# Patient Record
Sex: Female | Born: 1955 | Race: White | Hispanic: No | Marital: Married | Smoking: Former smoker
Health system: Southern US, Community
[De-identification: ages and names within clinical notes are randomized; demographics above are authoritative.]

## PROBLEM LIST (undated history)

## (undated) DIAGNOSIS — IMO0001 Reserved for inherently not codable concepts without codable children: Secondary | ICD-10-CM

## (undated) DIAGNOSIS — M1990 Unspecified osteoarthritis, unspecified site: Secondary | ICD-10-CM

## (undated) DIAGNOSIS — N200 Calculus of kidney: Secondary | ICD-10-CM

## (undated) DIAGNOSIS — F419 Anxiety disorder, unspecified: Secondary | ICD-10-CM

## (undated) DIAGNOSIS — C569 Malignant neoplasm of unspecified ovary: Secondary | ICD-10-CM

## (undated) DIAGNOSIS — E785 Hyperlipidemia, unspecified: Secondary | ICD-10-CM

## (undated) DIAGNOSIS — I499 Cardiac arrhythmia, unspecified: Secondary | ICD-10-CM

## (undated) DIAGNOSIS — G2581 Restless legs syndrome: Secondary | ICD-10-CM

## (undated) DIAGNOSIS — F329 Major depressive disorder, single episode, unspecified: Secondary | ICD-10-CM

## (undated) DIAGNOSIS — R079 Chest pain, unspecified: Secondary | ICD-10-CM

## (undated) DIAGNOSIS — Q639 Congenital malformation of kidney, unspecified: Secondary | ICD-10-CM

## (undated) DIAGNOSIS — I739 Peripheral vascular disease, unspecified: Secondary | ICD-10-CM

## (undated) DIAGNOSIS — K219 Gastro-esophageal reflux disease without esophagitis: Secondary | ICD-10-CM

## (undated) DIAGNOSIS — I10 Essential (primary) hypertension: Secondary | ICD-10-CM

## (undated) DIAGNOSIS — R51 Headache: Secondary | ICD-10-CM

## (undated) DIAGNOSIS — R739 Hyperglycemia, unspecified: Secondary | ICD-10-CM

## (undated) DIAGNOSIS — J189 Pneumonia, unspecified organism: Secondary | ICD-10-CM

## (undated) DIAGNOSIS — D649 Anemia, unspecified: Secondary | ICD-10-CM

## (undated) DIAGNOSIS — I82409 Acute embolism and thrombosis of unspecified deep veins of unspecified lower extremity: Secondary | ICD-10-CM

## (undated) DIAGNOSIS — M797 Fibromyalgia: Secondary | ICD-10-CM

## (undated) DIAGNOSIS — E215 Disorder of parathyroid gland, unspecified: Secondary | ICD-10-CM

## (undated) DIAGNOSIS — T8859XA Other complications of anesthesia, initial encounter: Secondary | ICD-10-CM

## (undated) DIAGNOSIS — C679 Malignant neoplasm of bladder, unspecified: Secondary | ICD-10-CM

## (undated) DIAGNOSIS — T4145XA Adverse effect of unspecified anesthetic, initial encounter: Secondary | ICD-10-CM

## (undated) DIAGNOSIS — F32A Depression, unspecified: Secondary | ICD-10-CM

## (undated) DIAGNOSIS — R519 Headache, unspecified: Secondary | ICD-10-CM

## (undated) DIAGNOSIS — Z9289 Personal history of other medical treatment: Secondary | ICD-10-CM

## (undated) DIAGNOSIS — K8020 Calculus of gallbladder without cholecystitis without obstruction: Secondary | ICD-10-CM

## (undated) HISTORY — PX: BREAST EXCISIONAL BIOPSY: SUR124

## (undated) HISTORY — DX: Gastro-esophageal reflux disease without esophagitis: K21.9

## (undated) HISTORY — DX: Calculus of gallbladder without cholecystitis without obstruction: K80.20

## (undated) HISTORY — PX: CARPAL TUNNEL RELEASE: SHX101

## (undated) HISTORY — DX: Hyperlipidemia, unspecified: E78.5

## (undated) HISTORY — PX: BREAST CYST EXCISION: SHX579

## (undated) HISTORY — DX: Personal history of other medical treatment: Z92.89

## (undated) HISTORY — PX: BREAST LUMPECTOMY: SHX2

## (undated) HISTORY — PX: FRACTURE SURGERY: SHX138

## (undated) HISTORY — DX: Anxiety disorder, unspecified: F41.9

## (undated) HISTORY — PX: BILATERAL SALPINGOOPHORECTOMY: SHX1223

## (undated) HISTORY — DX: Hyperglycemia, unspecified: R73.9

## (undated) HISTORY — DX: Acute embolism and thrombosis of unspecified deep veins of unspecified lower extremity: I82.409

## (undated) HISTORY — DX: Cardiac arrhythmia, unspecified: I49.9

## (undated) HISTORY — DX: Peripheral vascular disease, unspecified: I73.9

## (undated) HISTORY — PX: TONSILLECTOMY: SUR1361

---

## 1978-09-09 HISTORY — PX: TUBAL LIGATION: SHX77

## 2005-04-23 ENCOUNTER — Ambulatory Visit (HOSPITAL_COMMUNITY): Admission: RE | Admit: 2005-04-23 | Discharge: 2005-04-23 | Payer: Self-pay | Admitting: Family Medicine

## 2005-05-27 ENCOUNTER — Emergency Department (HOSPITAL_COMMUNITY): Admission: EM | Admit: 2005-05-27 | Discharge: 2005-05-27 | Payer: Self-pay | Admitting: Emergency Medicine

## 2005-05-31 ENCOUNTER — Encounter: Admission: RE | Admit: 2005-05-31 | Discharge: 2005-05-31 | Payer: Self-pay | Admitting: Neurological Surgery

## 2005-09-03 ENCOUNTER — Emergency Department (HOSPITAL_COMMUNITY): Admission: EM | Admit: 2005-09-03 | Discharge: 2005-09-03 | Payer: Self-pay | Admitting: Emergency Medicine

## 2005-09-09 HISTORY — PX: CARDIAC CATHETERIZATION: SHX172

## 2005-11-27 ENCOUNTER — Inpatient Hospital Stay (HOSPITAL_COMMUNITY): Admission: EM | Admit: 2005-11-27 | Discharge: 2005-11-28 | Payer: Self-pay | Admitting: Emergency Medicine

## 2005-12-02 ENCOUNTER — Encounter: Admission: RE | Admit: 2005-12-02 | Discharge: 2005-12-02 | Payer: Self-pay | Admitting: Gastroenterology

## 2005-12-12 ENCOUNTER — Encounter: Admission: RE | Admit: 2005-12-12 | Discharge: 2005-12-12 | Payer: Self-pay | Admitting: Surgery

## 2006-01-07 DIAGNOSIS — I82409 Acute embolism and thrombosis of unspecified deep veins of unspecified lower extremity: Secondary | ICD-10-CM

## 2006-01-07 HISTORY — PX: CHOLECYSTECTOMY: SHX55

## 2006-01-07 HISTORY — DX: Acute embolism and thrombosis of unspecified deep veins of unspecified lower extremity: I82.409

## 2006-01-17 ENCOUNTER — Encounter (INDEPENDENT_AMBULATORY_CARE_PROVIDER_SITE_OTHER): Payer: Self-pay | Admitting: *Deleted

## 2006-01-17 ENCOUNTER — Ambulatory Visit (HOSPITAL_COMMUNITY): Admission: RE | Admit: 2006-01-17 | Discharge: 2006-01-17 | Payer: Self-pay | Admitting: Surgery

## 2006-02-14 ENCOUNTER — Emergency Department (HOSPITAL_COMMUNITY): Admission: EM | Admit: 2006-02-14 | Discharge: 2006-02-14 | Payer: Self-pay | Admitting: Emergency Medicine

## 2006-09-15 ENCOUNTER — Observation Stay (HOSPITAL_COMMUNITY): Admission: EM | Admit: 2006-09-15 | Discharge: 2006-09-16 | Payer: Self-pay | Admitting: Emergency Medicine

## 2007-01-26 ENCOUNTER — Emergency Department (HOSPITAL_COMMUNITY): Admission: EM | Admit: 2007-01-26 | Discharge: 2007-01-26 | Payer: Self-pay | Admitting: Emergency Medicine

## 2011-01-25 NOTE — Op Note (Signed)
NAMESWEETIE, GIEBLER                ACCOUNT NO.:  [id number]   MEDICAL RECORD NO.:  [id number]          PATIENT TYPE:  AMB   LOCATION:  SDS                          FACILITY:  [facility]   PHYSICIAN:  Wilmon Arms. Corliss Skains, M.D. DATE OF BIRTH:  01-Mar-1956   DATE OF PROCEDURE:  01/17/2006  DATE OF DISCHARGE:  01/17/2006                                 OPERATIVE REPORT   PREOPERATIVE DIAGNOSIS:  Symptomatic cholelithiasis   POSTOPERATIVE DIAGNOSIS:  Symptomatic cholelithiasis   PROCEDURE PERFORMED:  Laparoscopic cholecystectomy with intraoperative  cholangiogram.   SURGEON:  Wilmon Arms. Corliss Skains, M.D.   ASSISTANT:  Cherylynn Ridges, M.D.   ANESTHESIA:  General endotracheal   INDICATIONS:  The patient is a 49-year female who was hospitalized in March  2007 for chest pain.  She underwent a complete cardiac workup including a  catheterization which was normal.  She has since been evaluated by Dr. Ewing Schlein  for noncardiac chest pain.  She has a long history of gastroesophageal  reflux.  This included a hiatal hernia.  An ultrasound showed multiple  gallstones but no wall thickening and normal common bile duct.  Her liver  function tests were normal.  She had an upper GI which showed a very small  sliding hiatal hernia with mild reflux but otherwise normal.  Due to these  findings and the patient's symptoms, we recommended a laparoscopic  cholecystectomy.   DESCRIPTION OF PROCEDURE:  The patient was brought to the operating and  placed in supine position on the operating table.  After an adequate level  of general endotracheal anesthesia was obtained, the patient's abdomen was  prepped with Betadine and draped in a sterile fashion.  A time-out was taken  to insure the proper patient and proper procedure.  The patient had a  previous umbilical laparoscopic incision.  This area was infiltrated 0.25%  Marcaine with epinephrine.  The umbilical incision was reopened.  Dissection  was carried down to the  fascia which was divided vertically.  The sides of  the fascia were grasped with Kocher clamps.  The peritoneum was bluntly  entered.  A stay sutures of 0 Vicryl was placed in a purse string fashion  and used to hold the Hasson cannula in place.  Pneumoperitoneum was obtained  by insufflating CO2 and maintaining a maximal pressure of 50 mmHg.  The the  scope was inserted and gross visual examination of the abdomen showed no  gross abnormalities.  The patient was positioned in reversed Trendelenburg  position and rotated slightly to her left.  Two 5 mm ports were placed in  the right upper quadrant and a 10 mm port was placed in a subxiphoid  position.  The gallbladder was grasped with a clamp and elevated over the  edge of the liver.  There were dense adhesions of the omentum and transverse  colon to the under surface of the gallbladder and liver.  These were taken  down sharply.  Once we were able to visualize the gallbladder completely, we  opened the peritoneum around the hilum of the gallbladder.  The cystic duct  was circumferentially dissected and ligated with a clip distally.  A small  opening was created on the cystic duct.  A Cook cholangiogram catheter was  inserted through a separate stab incision and threaded into the cystic duct.  It was secured with a clip and a cholangiogram showed a good flow proximally  and distally with no filling defects.  There was good flow into the  duodenum.  The cholangiogram catheter was then removed and the cystic duct  was ligated with clips and divided.  The cystic artery was also ligated with  clips and divided.  Cautery was then used to dissect the gallbladder free  from the liver bed.  Cautery was used for hemostasis in the wall of the  gallbladder fossa.  The gallbladder was then removed through the umbilical  incision.  The purse string suture was tied down to close the umbilical  fascia.  We reinspected the right upper quadrant and no  bleeding was noted.  The irrigant was suctioned out.  The ports were then removed under direct  vision while releasing the pneumoperitoneum.  The wounds were closed with 4-  0 Monocryl in a subcuticular fashion.  Steri-Strips and clean dressings were  applied.  The patient was extubated and brought to the recovery room in  stable condition.  All sponge, instrument, and needle counts correct.      Wilmon Arms. Tsuei, M.D.  Electronically Signed     MKT/MEDQ  D:  01/17/2006  T:  01/17/2006  Job:  [id number]   cc:   Petra Kuba, M.D.  Fax: [phone]

## 2011-01-25 NOTE — Discharge Summary (Signed)
[name], [name] NO.:  [id number]   MEDICAL RECORD NO.:  [id number]          PATIENT TYPE:  OBV   LOCATION:  4703                         FACILITY:  [facility]   PHYSICIAN:  Corky Crafts, MDDATE OF BIRTH:  1956-02-10   DATE OF ADMISSION:  09/15/2006  DATE OF DISCHARGE:  09/16/2006                               DISCHARGE SUMMARY   ADMISSION DIAGNOSES:  1. Chest pain, resolved.  2. Dyslipidemia, resolved.  3. Palpitations, resolved.   DISCHARGE DIAGNOSES:  1. Chest pain, resolved.  Myocardial infarction ruled out with      negative serial enzymes x2 with a peak troponin of less than 0.01.  2. Normal coronary arteries via cardiac catheterization November 27, 2005.  3. Normal left ventricular systolic function with an ejection fraction      of 50% via cardiac catheterization November 27, 2005.  4. Dyspnea, resolved.  Check BNP prior to discharge.  5. Palpitations, resolved.  The patient has a history of normal sinus      rhythm with occasional sinus tachycardia via Holter monitor in      December of 2006.  6. Hypertension, controlled.  7. Dyslipidemia.  8. Gastroesophageal reflux disease.   PROCEDURES:  None during this admission.   HOSPITAL COURSE:  [name] is a 55 year old female with no known  history of coronary artery disease.  She has normal coronary arteries  via cardiac catheterization, as well as LV systolic function an EF of  60% via cardiac catheterization on November 27, 2005.  She was admitted to  the [facility] for 23 hour observation on yesterday with a  chief complaint of chest pain, dyspnea and occasional palpitations.  Chest x-ray revealed no acute disease.  A 12-lead EKG revealed normal  sinus rhythm with a ventricular rate of 69 beats per minute with  nonspecific T-wave abnormalities with T-wave inversion in leads V1 and  V2, as well as flattened T-waves in AVL.  There was no evidence of  ischemia.  Serial cardiac enzymes  were negative x2 with a peak troponin  of less than 0.01.  TSH was normal.  BNP is pending prior to discharge.  The patient is being discharged home today in stable condition without  any further complaints of chest pain, shortness of breath or  palpitations.  On telemetry, she remained in normal sinus rhythm without  any episodes.  The patient was seen and examined by Dr. Everette Rank  prior to discharge and he is in agreement with this assessment and  discharge.   LABORATORY DATA:  TSH 0.748.  Serial cardiac enzymes:  CK total 94 and  64 respectively.  MB 1.7 and 1.1.  Troponin-I less than 0.01 x2.  Sodium  144, potassium 3.9, chloride 107, CO2 28, glucose 84, BUN 11, creatinine  0.97.  White blood count 5.9, hemoglobin 13.4, hematocrit 39.5,  platelets 215,000.  PT 12.2, INR 0.9, PTT 25.  Magnesium 2.5.  Uncorrected calcium 9.9.   X-rays as stated in the hospital course.   EKGs as stated in the hospital course.  CONDITION UPON DISCHARGE:  [name] is being discharged home today in  stable condition without any further complaint of chest pain, shortness  of breath or palpitations.   DISCHARGE MEDICATIONS:  The patient has been instructed to continue her  current medications as follows:  1. Atenolol 25 mg daily.  2. Coated aspirin 81 mg daily.  3. Enalapril 5 mg daily.  4. Pravastatin 20 mg daily.  5. Pepcid OTC 20 mg twice daily.   DISCHARGE INSTRUCTIONS:  Continue to follow a low-sodium, low-fat and  low-cholesterol diet.  There are no further restrictions.   FOLLOWUP ARRANGEMENTS:  Follow up with Dr. Eldridge Dace as scheduled in  March of 2008 or as needed.      [address] [place], [place]      Corky Crafts, MD  Electronically Signed    RDM/MEDQ  D:  09/16/2006  T:  09/16/2006  Job:  [id number]   cc:   Dario Guardian, M.D.

## 2011-01-25 NOTE — H&P (Signed)
[name], CERRO NO.:  [id number]   MEDICAL RECORD NO.:  [id number]          PATIENT TYPE:  EMS   LOCATION:  MAJO                         FACILITY:  [facility]   PHYSICIAN:  Corky Crafts, MDDATE OF BIRTH:  1955/11/12   DATE OF ADMISSION:  09/15/2006  DATE OF DISCHARGE:                              HISTORY & PHYSICAL   PRIMARY CARE PHYSICIAN:  Merri Brunette, MD at [facility].   CARDIOLOGIST:  Lance Muss, MD   CHIEF COMPLAINT:  Chest pain, shortness of breath, and palpitations.   HISTORY OF PRESENT ILLNESS:  [name] is a 55 year old Caucasian  female without a known cardiac history.  She has a history of  hypertension, dyslipidemia, and DVT in [facility] 2006.  She also has a  history of normal coronary arteries with normal LV systolic function and  EF of 60% via cardiac catheterization on March21,2007.  The  patient presented to the [facility] ED with a chief complaint of  intermittent, nonexertional chest pain that is described as tightness  for 1 week with radiation to her left shoulder and left shoulder blade,  leaving her left upper arm with a feeling of tightness as if a blood  pressure cuff is around her arm.  She admits to shortness of breath and  occasional palpitations for the past month that are also associated with  the chest pain.  She also admits to dizziness.  She denied diaphoresis,  nausea, or vomiting.  She rates the chest pain as 7/10 and denies  treatment for the chest pain.  She also denies a change in the chest  pain with inspiration, movement, meals, or palpation.  She has a history  of chest pain in [facility] 2007 that is not similar in quality or  character to this episode of chest pain.  She indicated that this  episode of chest pain is less severe.  Previous Holter monitor results  in [facility] 2006 revealed normal sinus rhythm and sinus  tachycardia.  On telemetry, the patient's heart rate is between 70 and  80  beats per minute.  A 12-lead EKG was performed and revealed normal  sinus rhythm with a ventricular rate of 69 beats per minute with  nonspecific T wave abnormalities with T wave inversion in leads V1 and  V2 and flattened T waves in aVL.  Point-of-care enzymes and portable  chest x-ray results are pending.   PAST MEDICAL HISTORY:  1. Hypertension.  2. Dyslipidemia.  3. Remote history of DVT in [facility] 2006, currently not on      anticoagulation.  4. Normal coronary arteries via cardiac catheterization on November 27, 2005.  5. Normal LV systolic function with the EF of 60% via cardiac      catheterization on November 27, 2005.  6. Remote history of tobacco abuse with cessation in [id number].  7. Restless leg syndrome that is not currently treated with      medications.  8. History of pneumonia in August and [facility] 2006.  9. History of ovarian cysts, status post oophorectomy.  10.Status post  cholecystectomy in [facility] 2007.   ALLERGIES:  IODINE-CONTAINING IV CONTRAST DYE -- shortness of breath.   CURRENT MEDICATIONS:  1. Atenolol 25 mg daily.  2. Enalapril daily (unknown dosage).  3. Pepcid 20 mg twice daily.  4. Pravastatin 20 mg daily.  5. Aspirin 81 mg daily.   FAMILY HISTORY:  1. Father living, age 68, with a neck aneurysm, prostate cancer.  2. Mother living, age 54, severe hypertension, diabetes mellitus.  3. Brother living, age 35, status post MI at 53 years of age.   SOCIAL HISTORY:  Married with 2 adult children, both healthy.  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  She has a remote  history of tobacco abuse with a 56-pack-year smoking history with  cessation in March2006.  She currently denies tobacco, alcohol, or  illicit drug use.   REVIEW OF SYSTEMS:  Positive for bilateral leg cramps at night, urinary  frequency, urgency, and retention.   PHYSICAL EXAM:  GENERAL:  A 55 year old female, pleasant and  cooperative, NAD.  VITALS:  Temperature  97.8, blood pressure 117/69, pulse 71 and regular,  respirations 18, O2 saturations 97% over room air.  HEENT:  Unremarkable.  NECK:  Supple without JVD or carotid bruits, bilaterally.  Carotid  upstrokes 2+.  PULMONARY:  Breath sounds are equal and clear to auscultation  bilaterally.  No use of accessory muscles.  CV:  Regular rate and rhythm.  Normal S1 and S2 without murmurs,  gallops, clicks or rubs.  ABDOMEN:  Soft, nontender, nondistended with  active bowel sounds.  No masses, organomegaly, or bilateral bruits.  EXTREMITIES:  No peripheral edema.  PT pulses 2+/2, bilaterally.  SKIN:  Warm and dry without rashes or lesions.  NEUROLOGIC:  No focal motor or sensory deficits.  PSYCHIATRIC:  Normal mood and affect.  BACK:  No kyphosis or scoliosis.   LABORATORY DATA:  White blood count 7.7, hemoglobin 14.1, hematocrit  41.5, platelets 229,000.  Sodium 138, potassium 4.2, chloride 107, CO2  26.6, BUN 13, creatinine 1.0, glucose 103.  PT 12.2, INR 0.9, PTT 25.  TSH, BNP, magnesium, point-of-care enzymes, and portable chest x-ray  results are pending.   EKG as stated in the HPI.   ASSESSMENT:  1. Possible acute coronary syndrome consistent with unstable angina.      Questionable etiology.  The patient has a history of normal      coronary arteries via cardiac catheterization in March 2007.  2. Palpitations with a controlled heart rate.  3. Hypertension, controlled.  4. Dyslipidemia.  5. History of normal coronary arteries via cardiac catheterization,      November 27, 2005.  6. Normal left ventricular systolic function with ejection fraction of      60% via cardiac catheterization, November 27, 2005.  7. Urinary frequency.  8. As stated in the past medical history.   PLAN:  1. Admit to cardiac telemetry unit for 23-hour observation under the      service of Dr. Everette Rank with a diagnosis of unstable angina. 2. Rule out MI.  Point-of-care and serial cardiac enzymes q.8 h. x3       are pending.  Notify MD if enzymes are elevated.  3. Continue home medications.  4. Subcu Lovenox q.12 h. per pharmacy protocol for DVT prophylaxis.  5. BMET and CBC in the a.m.  6. Twelve-lead EKG with recurrent chest pain.  7. Sublingual nitroglycerin 0.4 mg x3 as needed for chest pain.  8. Initiate Cardiology p.r.n.  orders.  9. The patient was seen, interviewed, and examined by Dr. Everette Rank, who participated in the medical decision-making and plan      of care.      [address] [place], [place]      Corky Crafts, MD  Electronically Signed    RDM/MEDQ  D:  09/15/2006  T:  09/16/2006  Job:  [id number]   cc:   Dario Guardian, M.D.

## 2011-01-25 NOTE — Discharge Summary (Signed)
[name], [name]                ACCOUNT NO.:  [id number]   MEDICAL RECORD NO.:  [id number]          PATIENT TYPE:  INP   LOCATION:  6532                         FACILITY:  [facility]   PHYSICIAN:  Corky Crafts, MDDATE OF BIRTH:  [birth year]   DATE OF ADMISSION:  11/27/2005  DATE OF DISCHARGE:  11/28/2005                                 DISCHARGE SUMMARY   DISCHARGE DIAGNOSES:  1.  Chest pain, noncardiac.  2.  Hypertension, treated.  3.  Remote history of deep venous thrombosis, not currently on Coumadin.  4.  Recent pneumonia in August and December of 2006.  5.  History of oophorectomy secondary to cancer.  6.  IV dye allergy.   Ms. Koike is a 55 year old female who presented to the emergency room on  November 27, 2005, with chest pain that has progressively worsened throughout  the day.  His chest pain was associated with left arm.  Because the pain  continued, she proceeded to the emergency room where EKG showed  T-wave  inversion in the anterior leads with minimal ST segment depression in the  inferolateral leads 1 mm  with minimal downsloping.  She was seen in  consultation by Dr. Delfin Edis who felt the patient needed to proceed to  the catheterization lab emergently.  Fortunately, cardiac catheterization  showed normal coronaries with normal left ventriculogram.  She remained in  the hospital overnight.  The following day, her lab studies were normal with  a BUN of 17, creatinine 1.1, hemoglobin 14.4, hematocrit 41.5.  Her LFTs  were normal.  She still complains of occasional chest pressure.  We did ask  for a gastroenterology consult through [facility], Petra Kuba, M.D.  At  this point, we feel she is ready for discharge to home and can have an  outpatient gastroenterology work-up.  I will discuss this with Dr. Ewing Schlein to  assure that we can set up this appointment.  In the meantime, she is to  continue her atenolol, Nexium, cholesterol medication, hydrochlorothiazide  and blood pressure pills as prior to admission.  I have asked her to bring  all these medications to the office for confirmation of names and dosages.  We have added a baby aspirin 81 mg a day to her medication regimen.  She is  to clean over her cath site with  soap and water.  No lifting over 10 pounds  for one week.  No  driving for two days.  Remain on low fat diet. She has an appointment to see  Dr. Eldridge Dace on December 04, 2005, at 1 p.m.  She is to call for any further  pain.   I saw and examined this patient on the day of discharge. -Everette Rank, MD      Guy Franco, P.A.      Corky Crafts, MD  Electronically Signed    LB/MEDQ  D:  11/28/2005  T:  11/29/2005  Job:  [id number]   cc:   Petra Kuba, M.D.  Fax: [phone]   Dario Guardian, M.D.  Fax: [phone]

## 2011-01-25 NOTE — H&P (Signed)
[name], TESCH                ACCOUNT NO.:  [id number]   MEDICAL RECORD NO.:  [id number]          PATIENT TYPE:  INP   LOCATION:  6532                         FACILITY:  [facility]   PHYSICIAN:  Colleen Can. Deborah Chalk, M.D.DATE OF BIRTH:  1955-11-28   DATE OF ADMISSION:  11/27/2005  DATE OF DISCHARGE:                                HISTORY & PHYSICAL   Ms. Yerian presented to the emergency department earlier today with onset of  chest pain that basically occurred after she was getting in her car to go to  work at 7 a.m. this morning.  It progressed throughout the course of the  day.  It is described as an indigestion-like feeling, however, it has  radiated now into the left arm.  She has had some associated shortness of  breath.  She is not sure about diaphoresis because that has been somewhat of  a chronic problem for her.  Apparently, she has had similar episodes, yet  not this severe.  She had a stress test done at [facility] in December  of 2006.  Apparently she has problems with tachycardia and the possibility  of pacemaker has been entertained in the past.  Her full history is not  available.  Her pain has basically continued throughout the course of the  day.  She was referred to the emergency room midday.  We were called here at  6 p.m.  Her EKG is noted to be abnormal.  She is continuing to have chest  pain and she is taken to the catheterization lab emergently.   PAST MEDICAL HISTORY:  She does have hypertension, hypercholesterolemia.  She had a negative stress test in December of 2006.  She is not diabetic.  She did have a DVT 1 year ago.  She has had recent pneumonia x2.  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   ALLERGIES:  X-RAY DYE which causes swelling.   MEDICATIONS:  1.  Atenolol.  2.  Nexium.  3.  Cholesterol pill.  4.  Hydrochlorothiazide.  5.  Blood pressure pill of which she does not know.   FAMILY HISTORY:  Father is alive at age  53.  Mother is alive at 46 and has  heart disease.  One brother age 53 has had a heart attack already.   SOCIAL HISTORY:  She is the Production designer, theatre/television/film of a school cafeteria.  She is married  with two grown children.  She has had no smoking for 1 year, but prior to  that smoked two packs a day.  She has no alcohol use.   REVIEW OF SYSTEMS:  Positive for tachycardia, edema, leg cramps, increased  stress, headaches, and abdominal fullness.   PHYSICAL EXAMINATION:  GENERAL:  She is currently complaining of chest pain.  VITAL SIGNS:  Blood pressure is 117/69, heart rate is 75, respiratory rate  16.  SKIN:  Warm and dry.  Color is tan.  LUNGS:  Basically clear.  HEART:  Regular rhythm.  ABDOMEN:  Soft and nontender.  EXTREMITIES:  Without edema.  NEUROLOGY:  No gross focal deficits.  LABORATORY DATA:  Cardiac markers are negative x2.  CBC is normal.  Chemistries are normal.   EKG shows anterior ischemia.   IMPRESSION:  1.  Chest pain.  2.  Hyperlipidemia.  3.  Hypertension.  4.  Positive family history.   PLAN:  She is emergently taken to the cardiac catheterization lab.  The  risks, procedure, and benefits have all been explained to her and she is  willing to proceed.      Sharlee Blew, N.P.      Colleen Can. Deborah Chalk, M.D.  Electronically Signed    LC/MEDQ  D:  11/27/2005  T:  11/28/2005  Job:  [id number]

## 2011-01-25 NOTE — Cardiovascular Report (Signed)
NAMESYANN, [name]                ACCOUNT NO.:  [id number]   MEDICAL RECORD NO.:  [id number]          PATIENT TYPE:  INP   LOCATION:  6532                         FACILITY:  [facility]   PHYSICIAN:  Colleen Can. Deborah Chalk, M.D.DATE OF BIRTH:  31-Aug-1956   DATE OF PROCEDURE:  11/27/2005  DATE OF DISCHARGE:  11/28/2005                              CARDIAC CATHETERIZATION   INDICATIONS FOR PROCEDURE:  MS. Hale Bogus presented to the emergency room with  prolonged chest pain and had T-wave inversions anteriorly. Cardiac enzymes  were negative.   PROCEDURE:  Left heart catheterization with selective coronary angiography  and left ventricular angiography.   TYPE AND SITE OF ENTRY:  Percutaneous right femoral artery graft.   CATHETERS:  A 6-French #4 curved Judkins right and left coronary cath, 6-  French pigtail ventriculographic catheter.   CONTRAST MATERIAL:  Omnipaque.   MEDICATIONS:  Medication given prior to procedure, Benadryl 50 milligrams  IV, Solu-Medrol 60 milligrams IV. Medications given during the procedure,  Versed 2 milligrams IV, fentanyl 25 micrograms IV.   COMMENT:  The patient tolerated the procedure well.   HEMODYNAMIC RESULTS:  Aortic pressure 141/81, LV is 143/7-21. There is no  aortic valve gradient noted on pullback.   ANGIOGRAPHIC DATA:  Left ventricular angiogram performed in the RAO  position. Overall cardiac size and silhouette were normal. The global  ejection fraction was 60%. Regional wall motion is normal.   1.  Right coronary artery: The right coronary artery is a large dominant      vessel. It is a posterior lateral branch and posterior descending      vessel. It is normal.  2.  Left main coronary artery is normal.  3.  Left circumflex: Left circumflex continues as four branches. They are      normal.  4.  Intermediate coronary: Intermediate coronary is normal.  5.  Left anterior descending: Left anterior descending is moderate-sized      vessel that ends  at the apex. It is normal.   OVERALL IMPRESSION:  1.  Normal left ventricular function.  2.  Normal coronary arteries.   The patient had femoral site closed with AngioSeal. She tolerated the  procedure well.      Colleen Can. Deborah Chalk, M.D.  Electronically Signed     SNT/MEDQ  D:  11/27/2005  T:  11/28/2005  Job:  [id number]   cc:   Corky Crafts, MD  Fax: [phone]

## 2012-04-03 ENCOUNTER — Inpatient Hospital Stay (HOSPITAL_COMMUNITY)
Admission: EM | Admit: 2012-04-03 | Discharge: 2012-04-04 | DRG: 313 | Disposition: A | Discharge status: Home / Self Care | Payer: Worker's Compensation | Attending: Internal Medicine | Admitting: Internal Medicine

## 2012-04-03 ENCOUNTER — Encounter (HOSPITAL_COMMUNITY): Payer: Self-pay | Admitting: *Deleted

## 2012-04-03 ENCOUNTER — Emergency Department (HOSPITAL_COMMUNITY): Payer: Worker's Compensation

## 2012-04-03 DIAGNOSIS — Z79899 Other long term (current) drug therapy: Secondary | ICD-10-CM

## 2012-04-03 DIAGNOSIS — Z7982 Long term (current) use of aspirin: Secondary | ICD-10-CM

## 2012-04-03 DIAGNOSIS — R079 Chest pain, unspecified: Secondary | ICD-10-CM

## 2012-04-03 DIAGNOSIS — IMO0001 Reserved for inherently not codable concepts without codable children: Secondary | ICD-10-CM | POA: Diagnosis present

## 2012-04-03 DIAGNOSIS — Z87891 Personal history of nicotine dependence: Secondary | ICD-10-CM

## 2012-04-03 DIAGNOSIS — I959 Hypotension, unspecified: Secondary | ICD-10-CM | POA: Diagnosis present

## 2012-04-03 DIAGNOSIS — K219 Gastro-esophageal reflux disease without esophagitis: Secondary | ICD-10-CM | POA: Diagnosis present

## 2012-04-03 DIAGNOSIS — R339 Retention of urine, unspecified: Secondary | ICD-10-CM

## 2012-04-03 DIAGNOSIS — J9819 Other pulmonary collapse: Secondary | ICD-10-CM | POA: Diagnosis present

## 2012-04-03 DIAGNOSIS — F3289 Other specified depressive episodes: Secondary | ICD-10-CM | POA: Diagnosis present

## 2012-04-03 DIAGNOSIS — R0789 Other chest pain: Principal | ICD-10-CM | POA: Diagnosis present

## 2012-04-03 DIAGNOSIS — J189 Pneumonia, unspecified organism: Secondary | ICD-10-CM

## 2012-04-03 DIAGNOSIS — M129 Arthropathy, unspecified: Secondary | ICD-10-CM | POA: Diagnosis present

## 2012-04-03 DIAGNOSIS — F329 Major depressive disorder, single episode, unspecified: Secondary | ICD-10-CM

## 2012-04-03 DIAGNOSIS — M797 Fibromyalgia: Secondary | ICD-10-CM | POA: Diagnosis present

## 2012-04-03 DIAGNOSIS — G2581 Restless legs syndrome: Secondary | ICD-10-CM

## 2012-04-03 DIAGNOSIS — I10 Essential (primary) hypertension: Secondary | ICD-10-CM | POA: Diagnosis present

## 2012-04-03 DIAGNOSIS — F32A Depression, unspecified: Secondary | ICD-10-CM | POA: Diagnosis present

## 2012-04-03 HISTORY — DX: Essential (primary) hypertension: I10

## 2012-04-03 HISTORY — DX: Depression, unspecified: F32.A

## 2012-04-03 HISTORY — DX: Restless legs syndrome: G25.81

## 2012-04-03 HISTORY — DX: Fibromyalgia: M79.7

## 2012-04-03 HISTORY — DX: Major depressive disorder, single episode, unspecified: F32.9

## 2012-04-03 HISTORY — DX: Unspecified osteoarthritis, unspecified site: M19.90

## 2012-04-03 HISTORY — DX: Chest pain, unspecified: R07.9

## 2012-04-03 LAB — BASIC METABOLIC PANEL
BUN: 19 mg/dL (ref 6–23)
Chloride: 105 mEq/L (ref 96–112)
Creatinine, Ser: 0.97 mg/dL (ref 0.50–1.10)
Glucose, Bld: 93 mg/dL (ref 70–99)
Potassium: 4.2 mEq/L (ref 3.5–5.1)

## 2012-04-03 LAB — CREATININE, SERUM
GFR calc Af Amer: 82 mL/min — ABNORMAL LOW (ref >90)
GFR calc non Af Amer: 71 mL/min — ABNORMAL LOW (ref >90)

## 2012-04-03 LAB — CBC
HCT: 36.9 % (ref 36.0–46.0)
HCT: 39.2 % (ref 36.0–46.0)
Hemoglobin: 12.6 g/dL (ref 12.0–15.0)
Hemoglobin: 13.9 g/dL (ref 12.0–15.0)
MCV: 89.8 fL (ref 78.0–100.0)
MCV: 90.7 fL (ref 78.0–100.0)
RBC: 4.32 MIL/uL (ref 3.87–5.11)
RDW: 12.4 % (ref 11.5–15.5)
WBC: 5.8 10*3/uL (ref 4.0–10.5)
WBC: 5.1 10*3/uL (ref 4.0–10.5)

## 2012-04-03 LAB — CARDIAC PANEL(CRET KIN+CKTOT+MB+TROPI)
Relative Index: INVALID (ref 0.0–2.5)
Total CK: 95 U/L (ref 7–177)
Troponin I: <0.3 ng/mL (ref <0.30)

## 2012-04-03 MED ORDER — SODIUM CHLORIDE 0.9 % IJ SOLN: 3.0000 mL | Freq: Two times a day (BID) | INTRAMUSCULAR | Status: DC

## 2012-04-03 MED ORDER — MOXIFLOXACIN HCL IN NACL 400 MG/250ML IV SOLN
400.0000 mg | Freq: Once | INTRAVENOUS | Status: AC
  Administered 2012-04-03: 400 mg via INTRAVENOUS
  Filled 2012-04-03: qty 250

## 2012-04-03 MED ORDER — SODIUM CHLORIDE 0.9 % IV SOLN: 250.0000 mL | INTRAVENOUS | Status: DC | PRN

## 2012-04-03 MED ORDER — HEPARIN SODIUM (PORCINE) 5000 UNIT/ML IJ SOLN
5000.0000 [IU] | Freq: Three times a day (TID) | INTRAMUSCULAR | Status: DC
  Administered 2012-04-03 – 2012-04-04 (×2): 5000 [IU] via SUBCUTANEOUS
  Filled 2012-04-03 (×5): qty 1

## 2012-04-03 MED ORDER — SODIUM CHLORIDE 0.9 % IJ SOLN: 3.0000 mL | INTRAMUSCULAR | Status: DC | PRN

## 2012-04-03 MED ORDER — ASPIRIN EC 81 MG PO TBEC
81.0000 mg | DELAYED_RELEASE_TABLET | Freq: Every day | ORAL | Status: DC
  Administered 2012-04-04: 81 mg via ORAL
  Filled 2012-04-03: qty 1

## 2012-04-03 MED ORDER — HYDROCODONE-ACETAMINOPHEN 5-325 MG PO TABS
1.0000 | ORAL_TABLET | ORAL | Status: DC | PRN
  Administered 2012-04-03 – 2012-04-04 (×4): 1 via ORAL
  Filled 2012-04-03 (×5): qty 1

## 2012-04-03 MED ORDER — MOXIFLOXACIN HCL IN NACL 400 MG/250ML IV SOLN
400.0000 mg | INTRAVENOUS | Status: DC
  Administered 2012-04-03: 400 mg via INTRAVENOUS
  Filled 2012-04-03 (×2): qty 250

## 2012-04-03 MED ORDER — ESCITALOPRAM OXALATE 10 MG PO TABS
10.0000 mg | ORAL_TABLET | Freq: Every day | ORAL | Status: DC
  Administered 2012-04-04: 10 mg via ORAL
  Filled 2012-04-03: qty 1

## 2012-04-03 MED ORDER — ASPIRIN EC 81 MG PO TBEC
81.0000 mg | DELAYED_RELEASE_TABLET | Freq: Every day | ORAL | Status: DC
Start: 2012-04-03 — End: 2012-04-03
  Filled 2012-04-03: qty 1

## 2012-04-03 MED ORDER — POLYETHYLENE GLYCOL 3350 17 G PO PACK
17.0000 g | PACK | Freq: Every day | ORAL | Status: DC | PRN
  Filled 2012-04-03: qty 1

## 2012-04-03 MED ORDER — ONDANSETRON HCL 4 MG PO TABS: 4.0000 mg | ORAL_TABLET | Freq: Four times a day (QID) | ORAL | Status: DC | PRN

## 2012-04-03 MED ORDER — PANTOPRAZOLE SODIUM 40 MG PO TBEC: 40.0000 mg | DELAYED_RELEASE_TABLET | Freq: Every day | ORAL | Status: DC

## 2012-04-03 MED ORDER — HYDROMORPHONE HCL PF 1 MG/ML IJ SOLN: 1.0000 mg | INTRAMUSCULAR | Status: DC | PRN

## 2012-04-03 MED ORDER — ATENOLOL 25 MG PO TABS
25.0000 mg | ORAL_TABLET | Freq: Every day | ORAL | Status: DC
  Administered 2012-04-04: 25 mg via ORAL
  Filled 2012-04-03: qty 1

## 2012-04-03 MED ORDER — MORPHINE SULFATE 4 MG/ML IJ SOLN
4.0000 mg | Freq: Once | INTRAMUSCULAR | Status: AC
  Administered 2012-04-03: 4 mg via INTRAVENOUS
  Filled 2012-04-03: qty 1

## 2012-04-03 MED ORDER — ENALAPRIL MALEATE 5 MG PO TABS
5.0000 mg | ORAL_TABLET | Freq: Every day | ORAL | Status: DC
  Administered 2012-04-04: 5 mg via ORAL
  Filled 2012-04-03: qty 1

## 2012-04-03 MED ORDER — ONDANSETRON HCL 4 MG/2ML IJ SOLN: 4.0000 mg | Freq: Four times a day (QID) | INTRAMUSCULAR | Status: DC | PRN

## 2012-04-03 MED ORDER — SODIUM CHLORIDE 0.9 % IV BOLUS (SEPSIS)
1000.0000 mL | Freq: Once | INTRAVENOUS | Status: AC
  Administered 2012-04-03: 1000 mL via INTRAVENOUS

## 2012-04-03 NOTE — ED Notes (Signed)
Pt reports she wants to wait till pain is worse before asking for pain med

## 2012-04-03 NOTE — ED Notes (Signed)
Report called to floor, [place] RN. No further questions at this time. Pt. Updated on plan of care: Will be moved to the floor as soon as possible. Resp. Even and unlabored . A.O. X 4. Vitals WNL. Pain 1/10 left chest non radiating. Family at bedside.

## 2012-04-03 NOTE — ED Notes (Signed)
Pt c/o midsternal CP upon awakening. States has felt this pain before. Pt had clean cardiac cath few years ago with [facility]. Denies n/v, diaphoresis. Does c/o mild SOB. CP reproduceable, states tender to touch.

## 2012-04-03 NOTE — ED Notes (Signed)
Admitting MD at bedside.

## 2012-04-03 NOTE — ED Provider Notes (Signed)
History     CSN: [id number]  Arrival date & time 04/03/12  1059   First MD Initiated Contact with Patient 04/03/12 1123      Chief Complaint  Patient presents with  . Chest Pain    The history is provided by the patient.   the patient had a 45 minute operative procedure on her left wrist today and when she awoke from anesthesia in the PACU she had significant left-sided chest pain without radiation.  She reported mild shortness of breath.  She has no known history of coronary artery disease.  She does have a history of hypertension and prior chest pain.  It appears as though she's had a heart catheterization many years ago which demonstrated normal coronary arteries.  She previously was moving her left arm without any difficulties prior to surgery and this was done as an outpatient for tendon and cartilage issues from her prior fall in February 2013.  She denies unilateral leg swelling.  She has no history of DVT or pulmonary embolism.  She does report that she had several days of cough congestion and "not feeling well" prior to surgery.  She had no preceding chest pain before surgery.  Her symptoms are mild to moderate in severity The patient was having Past Medical History  Diagnosis Date  . Hypertension   . Fibromyalgia   . Depression   . Arthritis   . Restless leg syndrome   . Chest pain     Past Surgical History  Procedure Date  . Wrist surgery   . Cardiac catheterization     No family history on file.  History  Substance Use Topics  . Smoking status: Former Games developer  . Smokeless tobacco: Not on file  . Alcohol Use: No    OB History    Grav Para Term Preterm Abortions TAB SAB Ect Mult Living                  Review of Systems  All other systems reviewed and are negative.    Allergies  Iohexol  Home Medications   Current Outpatient Rx  Name Route Sig Dispense Refill  . ASPIRIN EC 81 MG PO TBEC Oral Take 81 mg by mouth daily.    . ATENOLOL 25 MG PO TABS  Oral Take 25 mg by mouth daily.    . ENALAPRIL MALEATE 5 MG PO TABS Oral Take 5 mg by mouth daily.    Marland Kitchen ESCITALOPRAM OXALATE 10 MG PO TABS Oral Take 10 mg by mouth daily.    Marland Kitchen PREVACID PO Oral Take 1 capsule by mouth daily.    Marland Kitchen VITAMIN D (ERGOCALCIFEROL) 50000 UNITS PO CAPS Oral Take 50,000 Units by mouth every 7 (seven) days.      BP 103/50  Pulse 54  Temp 97.9 F (36.6 C) (Oral)  Resp 15  SpO2 98%  Physical Exam  Nursing note and vitals reviewed. Constitutional: She is oriented to person, place, and time. She appears well-developed and well-nourished. No distress.  HENT:  Head: Normocephalic and atraumatic.  Eyes: EOM are normal.  Neck: Normal range of motion.  Cardiovascular: Normal rate, regular rhythm and normal heart sounds.   Pulmonary/Chest: Effort normal and breath sounds normal.  Abdominal: Soft. She exhibits no distension. There is no tenderness.  Musculoskeletal: Normal range of motion.  Neurological: She is alert and oriented to person, place, and time.  Skin: Skin is warm and dry.  Psychiatric: She has a normal mood and affect. Judgment  normal.    ED Course  Procedures (including critical care time)   Date: 04/03/2012  Rate: 60  Rhythm: normal sinus rhythm  QRS Axis: normal  Intervals: normal  ST/T Wave abnormalities: normal  Conduction Disutrbances: none  Narrative Interpretation:   Old EKG Reviewed: No significant changes noted     Labs Reviewed  BASIC METABOLIC PANEL - Abnormal; Notable for the following:    GFR calc non Af Amer 66 (*)     GFR calc Af Amer 76 (*)     All other components within normal limits  CBC  TROPONIN I  TROPONIN I  D-DIMER, QUANTITATIVE  URINE CULTURE   Dg Chest 2 View  04/03/2012  *RADIOLOGY REPORT*  Clinical Data: Chest pain  CHEST - 2 VIEW  Comparison: None.  Findings: Cardiomediastinal silhouette is stable.  No pulmonary edema.  Stable central mild bronchitic changes.  There is patchy atelectasis or early  infiltrate in the lingula.  Bilateral basilar atelectasis. Mild degenerative changes thoracic spine.  IMPRESSION: No pulmonary edema.  Stable central mild bronchitic changes.  There is patchy atelectasis or early infiltrate in the lingula. Bilateral basilar atelectasis.  Original Report Authenticated By: Natasha Mead, M.D.    I personally reviewed the imaging tests through PACS system  I reviewed available ER/hospitalization records thought the EMR   1. CAP (community acquired pneumonia)   2. Chest pain   3. Urinary retention       MDM  The patient's pain may represent atelectasis versus pneumonia.  IV Avelox given.  The patient is unable to get a CT scan to evaluate for PE even her history of IV contrast allergy.  D-dimer is been sent.  Patient will be admitted to the hospitalist service for observation tonight.        Lyanne Co, MD 04/03/12 1705

## 2012-04-03 NOTE — ED Notes (Signed)
Patient transported to X-ray 

## 2012-04-03 NOTE — ED Notes (Signed)
[facility]. Upon awakening from anesthesia at 1000 c/o midsternal CP. Given NTG x3, ASA 324mg , morphine 7mg  total prior to EMS arrival. EMS gave NTG x1 with minimal relief of CP

## 2012-04-03 NOTE — ED Notes (Signed)
Patient being transported on portable cardiac monitor with RN and family members.

## 2012-04-03 NOTE — H&P (Signed)
[facility] History and Physical  [name] EAV:[id number] DOB: 15-Apr-1956 DOA: 04/03/2012   PCP: No primary provider on file.   Chief Complaint: Chest pain  HPI:  This is a 56 year old female with past medical history of tobacco abuse with a clean coronaries cath in 2007 an EF of 50% in March 2007, also with a history of GERD and back on the day of admission had recent wrist surgery as an outpatient. After surgery she started developing sudden onset of chest pain which has progressively gotten worse. She relates nothing to better or worse, except for the nitroglycerin she got when she came here to the ED. She relates is non-positional, does not reminder of her GERD. She relates no associated symptoms like shortness of breath palpitation. She did relate some sweating when she had this chest pain. We were asked to admit and further evaluate.  Review of Systems:  Constitutional:  No weight loss, night sweats, Fevers, chills, fatigue.  HEENT:  No headaches, Difficulty swallowing,Tooth/dental problems,Sore throat,  No sneezing, itching, ear ache, nasal congestion, post nasal drip,  Cardio-vascular:   PND, swelling in lower extremities, anasarca, dizziness, palpitations  GI:  No heartburn, indigestion, abdominal pain, nausea, vomiting, diarrhea, change in bowel habits, loss of appetite  Resp:  No shortness of breath with exertion or at rest. No excess mucus, no productive cough, No non-productive cough, No coughing up of blood.No change in color of mucus.No wheezing.No chest wall deformity  Skin:  no rash or lesions.  GU:  no dysuria, change in color of urine, no urgency or frequency. No flank pain.  Musculoskeletal:  No joint pain or swelling. No decreased range of motion. No back pain.  Psych:  No change in mood or affect. No depression or anxiety. No memory loss.    Past Medical History  Diagnosis Date  . Hypertension   . Fibromyalgia   . Depression   . Arthritis   .  Restless leg syndrome   . Chest pain    Past Surgical History  Procedure Date  . Wrist surgery   . Cardiac catheterization    Social History:  reports that she has quit smoking. Her smoking use included Cigarettes. She quit after 27 years of use. She does not have any smokeless tobacco history on file. She reports that she does not drink alcohol or use illicit drugs.  Allergies  Allergen Reactions  . Iohexol Shortness Of Breath    Family History  Problem Relation Age of Onset  . Hypertension Mother   . Hypothyroidism Mother   . Prostate cancer Father     Prior to Admission medications   Medication Sig Start Date End Date Taking? Authorizing Provider  aspirin EC 81 MG tablet Take 81 mg by mouth daily.   Yes Historical Provider, MD  atenolol (TENORMIN) 25 MG tablet Take 25 mg by mouth daily.   Yes Historical Provider, MD  enalapril (VASOTEC) 5 MG tablet Take 5 mg by mouth daily.   Yes Historical Provider, MD  escitalopram (LEXAPRO) 10 MG tablet Take 10 mg by mouth daily.   Yes Historical Provider, MD  Lansoprazole (PREVACID PO) Take 1 capsule by mouth daily.   Yes Historical Provider, MD  Vitamin D, Ergocalciferol, (DRISDOL) 50000 UNITS CAPS Take 50,000 Units by mouth every 7 (seven) days.   Yes Historical Provider, MD   Physical Exam: Filed Vitals:   04/03/12 1430 04/03/12 1500 04/03/12 1530 04/03/12 1618  BP: 95/51 101/59 94/56 103/50  Pulse: 54 60  53 54  Temp:    97.9 F (36.6 C)  TempSrc:    Oral  Resp: 16 22 19 15   SpO2: 99% 99% 99% 98%   BP 103/50  Pulse 54  Temp 97.9 F (36.6 C) (Oral)  Resp 15  SpO2 98%  General Appearance:    Alert, cooperative, no distress, appears older than her age  Head:    Normocephalic, without obvious abnormality, atraumatic           Throat:   Lips, mucosa, and tongue normal; teeth and gums normal  Neck:   Supple, symmetrical, trachea midline, no adenopathy;    thyroid:  no enlargement/tenderness/nodules; no carotid   bruit or  JVD  Back:     Symmetric, no curvature, ROM normal, no CVA tenderness  Lungs:     Clear to auscultation bilaterally, respirations unlabored  Chest Wall:    No tenderness or deformity   Heart:    Regular rate and rhythm, S1 and S2 normal, no murmur, rub   or gallop     Abdomen:     Soft, non-tender, bowel sounds active all four quadrants,    no masses, no organomegaly        Extremities:   Extremities normal, atraumatic, no cyanosis or edema  Pulses:   2+ and symmetric all extremities  Skin:   Skin color, texture, turgor normal, no rashes or lesions  Lymph nodes:   Cervical, supraclavicular, and axillary nodes normal  Neurologic:   CNII-XII intact, normal strength, sensation and reflexes    throughout    Labs on Admission:  Basic Metabolic Panel:  Lab 04/03/12 1610  NA 140  K 4.2  CL 105  CO2 25  GLUCOSE 93  BUN 19  CREATININE 0.97  CALCIUM 10.0  MG --  PHOS --   Liver Function Tests: No results found for this basename: AST:5,ALT:5,ALKPHOS:5,BILITOT:5,PROT:5,ALBUMIN:5 in the last 168 hours No results found for this basename: LIPASE:5,AMYLASE:5 in the last 168 hours No results found for this basename: AMMONIA:5 in the last 168 hours CBC:  Lab 04/03/12 1220  WBC 5.8  NEUTROABS --  HGB 12.6  HCT 36.9  MCV 89.8  PLT 165   Cardiac Enzymes:  Lab 04/03/12 1519 04/03/12 1220  CKTOTAL -- --  CKMB -- --  CKMBINDEX -- --  TROPONINI <0.30 <0.30   BNP: No components found with this basename: POCBNP:5 CBG: No results found for this basename: GLUCAP:5 in the last 168 hours  Radiological Exams on Admission: Dg Chest 2 View  04/03/2012  *RADIOLOGY REPORT*  Clinical Data: Chest pain  CHEST - 2 VIEW  Comparison: None.  Findings: Cardiomediastinal silhouette is stable.  No pulmonary edema.  Stable central mild bronchitic changes.  There is patchy atelectasis or early infiltrate in the lingula.  Bilateral basilar atelectasis. Mild degenerative changes thoracic spine.   IMPRESSION: No pulmonary edema.  Stable central mild bronchitic changes.  There is patchy atelectasis or early infiltrate in the lingula. Bilateral basilar atelectasis.  Original Report Authenticated By: Natasha Mead, M.D.    EKG: Independently reviewed. Normal sinus rhythm normal axis  Assessment/Plan: Principal Problem: Atypical chest pain: -Limited to step down that she has ongoing pain, we'll cycle her cardiac enzymes, repeat an EKG in the morning. Also check a TSH. Quite concerning that her chest x-ray shows the infiltrate on the left as she just had recent hand surgery and was under heavy sedation I am concerned about aspiration pneumonia contributing to chest pain. She relates her  pain is not pleuritic at all she eventually had a d-dimer by the ED that was less than 0.48. Also repeat an EKG in the morning.  -I will go ahead and start her on Avelox IV. Will give her an incentive spirometry. We'll start her on IV fluids and repeat a chest x-ray in the morning. At this time she doesn't have a fever her white count. But as mentioned. We the likelihood of her aspirating during her surgical procedures are high with a new infiltrate on the lingular area.  Fibromyalgia: Continue home meds no changes were made.  Depression: No changes were made continue home meds.   Time spend: Greater than 45 minute Code Status: Full code Family Communication: Patient  Disposition Plan: To be determined  Marinda Elk, MD  [facility] Pager [phone]  If 7PM-7AM, please contact night-coverage [email or web address] Password [email or web address] 04/03/2012, 6:13 PM

## 2012-04-03 NOTE — ED Notes (Signed)
Patient transported to CT 

## 2012-04-04 ENCOUNTER — Inpatient Hospital Stay (HOSPITAL_COMMUNITY): Payer: Worker's Compensation

## 2012-04-04 DIAGNOSIS — G2581 Restless legs syndrome: Secondary | ICD-10-CM

## 2012-04-04 DIAGNOSIS — IMO0001 Reserved for inherently not codable concepts without codable children: Secondary | ICD-10-CM

## 2012-04-04 LAB — COMPREHENSIVE METABOLIC PANEL
ALT: 30 U/L (ref 0–35)
Albumin: 3.3 g/dL — ABNORMAL LOW (ref 3.5–5.2)
Alkaline Phosphatase: 67 U/L (ref 39–117)
BUN: 13 mg/dL (ref 6–23)
Chloride: 107 mEq/L (ref 96–112)
GFR calc Af Amer: 73 mL/min — ABNORMAL LOW (ref >90)
Glucose, Bld: 92 mg/dL (ref 70–99)
Potassium: 4.1 mEq/L (ref 3.5–5.1)
Sodium: 143 mEq/L (ref 135–145)
Total Bilirubin: 0.5 mg/dL (ref 0.3–1.2)

## 2012-04-04 LAB — CARDIAC PANEL(CRET KIN+CKTOT+MB+TROPI): CK, MB: 2.9 ng/mL (ref 0.3–4.0)

## 2012-04-04 LAB — CBC
HCT: 38 % (ref 36.0–46.0)
MCHC: 32.6 g/dL (ref 30.0–36.0)
MCV: 91.6 fL (ref 78.0–100.0)
Platelets: 168 10*3/uL (ref 150–400)
RDW: 12.7 % (ref 11.5–15.5)
WBC: 4.5 10*3/uL (ref 4.0–10.5)

## 2012-04-04 MED ORDER — DIPHENHYDRAMINE HCL 25 MG PO CAPS
25.0000 mg | ORAL_CAPSULE | Freq: Three times a day (TID) | ORAL | Status: DC | PRN
  Administered 2012-04-04 (×2): 25 mg via ORAL
  Filled 2012-04-04 (×2): qty 1

## 2012-04-04 NOTE — Discharge Summary (Signed)
DISCHARGE SUMMARY  [name]  MR#: [id number]  DOB:Dec 14, 1955  Date of Admission: 04/03/2012 Date of Discharge: 04/04/2012  Attending Physician:Syesha Thaw T  Patient's PCP: Dr. Merri Brunette w/ [facility] Consults: none  Disposition: d/c home   Follow-up Appts: Follow-up Information    Follow up with Corky Crafts., MD. (Keep your scheduled appointment as discussed)    Contact information:   [address] [place] [address] [phone]       Follow up with Allean Found, MD. (Call to arrange a followup in 7-10 days to have your blood pressure rechecked)    Contact information:   [address] [place] [address] [phone]    Tests Needing Follow-up: Consider outpt stress testing   Discharge Diagnoses: Present on Admission:  .Depression .Atypical chest pain .Fibromyalgia  Initial presentation: 56 year old female with past medical history of tobacco abuse with a clean coronaries cath in 2007 and an EF of 50% in March 2007, also with a history of GERD. The patient had recent wrist surgery as an outpatient. After surgery she started developing sudden onset of chest pain which has progressively gotten worse. She relates nothing which makes it better or worse, except for the nitroglycerin she got when she came here to the ED.   Hospital Course: Atypical chest pain  Cardiac enzymes negative x3 - T waves are consistently inverted in leads V1 through V3 and lead III - no old EKGs are available even in E chart - d-dimer was normal - symptoms are not consistent with angina by history - the patient's risk factor profile is minimal - I feel she is safe for an outpatient workup - she informs me that she is very scheduled to see Dr. Eldridge Dace within the next 3 weeks - I have encouraged her to keep this appointment but to return to the emergency room immediately should she develop symptoms concerning for anginal type  chest pain  Possible infiltrate/pneumonia of the lingula  The patient does not have an elevated white blood cell count - she is afebrile - her followup chest x-ray suggested resolution of the lingular opacity but a possible newly appreciated left lower lobe infiltrate versus atelectasis - I suspect what we are seeing is atelectasis related to her bedrest status - given a complete lack of clinical signs to suggest pneumonia I do not feel that further antibiotic therapy is warranted  Fibromyalgia  Stable at present  Relative hypotension with a History of Hypertension  The patient appears to have been slightly dehydrated at the time of her presentation - I have encouraged her to push oral fluids - I have asked her to hold her ACE inhibitor until she is seen in medical followup  Restless leg syndrome  No active complications during hospital stay  Status post recent wrist surgery  Patient reportedly had a 45 minute operative procedure on the left wrist on the date of her admission - she has hardly been prescribed outpatient pain medication and has a followup appointment with the surgeon  Medication List  As of 04/04/2012  3:20 PM   STOP taking these medications         enalapril 5 MG tablet         TAKE these medications         aspirin EC 81 MG tablet   Take 81 mg by mouth daily.      atenolol 25 MG tablet   Commonly known as: TENORMIN   Take  25 mg by mouth daily.      escitalopram 10 MG tablet   Commonly known as: LEXAPRO   Take 10 mg by mouth daily.      PREVACID PO   Take 1 capsule by mouth daily.      Vitamin D (Ergocalciferol) 50000 UNITS Caps   Commonly known as: DRISDOL   Take 50,000 Units by mouth every 7 (seven) days.           Day of Discharge BP 98/51  Pulse 62  Temp 98.1 F (36.7 C) (Oral)  Resp 16  Ht 5\' 2"  (1.575 m)  Wt 89.4 kg (197 lb 1.5 oz)  BMI 36.05 kg/m2  SpO2 98%  Physical Exam: General: No acute respiratory distress Lungs: Clear to  auscultation bilaterally without wheezes or crackles Cardiovascular: Regular rate and rhythm without murmur gallop or rub  Abdomen: Nontender, nondistended, soft, bowel sounds positive, no rebound, no ascites, no appreciable mass Extremities: No significant cyanosis, clubbing, or edema bilateral lower extremities  D-DIMER, QUANTITATIVE     Status: Normal   Collection Time   04/03/12  4:34 PM      Component Value Range   D-Dimer, Quant 0.36  0.00 - 0.48 ug/mL-FEU  MRSA PCR SCREENING     Status: Normal   Collection Time   04/03/12  8:39 PM      Component Value Range   MRSA by PCR NEGATIVE  NEGATIVE  CARDIAC PANEL(CRET KIN+CKTOT+MB+TROPI)     Status: Normal   Collection Time   04/03/12  9:10 PM      Component Value Range   Total CK 95  7 - 177 U/L   CK, MB 2.6  0.3 - 4.0 ng/mL   Troponin I <0.30  <0.30 ng/mL   Relative Index RELATIVE INDEX IS INVALID  0.0 - 2.5  TSH     Status: Normal   Collection Time   04/03/12  9:11 PM      Component Value Range   TSH 0.957  0.350 - 4.500 uIU/mL  CARDIAC PANEL(CRET KIN+CKTOT+MB+TROPI)     Status: Normal   Collection Time   04/04/12  5:50 AM      Component Value Range   Total CK 83  7 - 177 U/L   CK, MB 2.2  0.3 - 4.0 ng/mL   Troponin I <0.30  <0.30 ng/mL   Relative Index RELATIVE INDEX IS INVALID  0.0 - 2.5  COMPREHENSIVE METABOLIC PANEL     Status: Abnormal   Collection Time   04/04/12  5:50 AM      Component Value Range   Sodium 143  135 - 145 mEq/L   Potassium 4.1  3.5 - 5.1 mEq/L   Chloride 107  96 - 112 mEq/L   CO2 25  19 - 32 mEq/L   Glucose, Bld 92  70 - 99 mg/dL   BUN 13  6 - 23 mg/dL   Creatinine, Ser 1.61  0.50 - 1.10 mg/dL   Calcium 9.2  8.4 - 09.6 mg/dL   Total Protein 5.7 (*) 6.0 - 8.3 g/dL   Albumin 3.3 (*) 3.5 - 5.2 g/dL   AST 30  0 - 37 U/L   ALT 30  0 - 35 U/L   Alkaline Phosphatase 67  39 - 117 U/L   Total Bilirubin 0.5  0.3 - 1.2 mg/dL   GFR calc non Af Amer 63 (*) >90 mL/min   GFR calc Af Amer 73 (*) >90 mL/min  CBC     Status: Normal   Collection Time   04/04/12  5:50 AM      Component Value Range   WBC 4.5  4.0 - 10.5 K/uL   RBC 4.15  3.87 - 5.11 MIL/uL   Hemoglobin 12.4  12.0 - 15.0 g/dL   HCT 16.1  09.6 - 04.5 %   MCV 91.6  78.0 - 100.0 fL   MCH 29.9  26.0 - 34.0 pg   MCHC 32.6  30.0 - 36.0 g/dL   RDW 40.9  81.1 - 91.4 %   Platelets 168  150 - 400 K/uL  CARDIAC PANEL(CRET KIN+CKTOT+MB+TROPI)     Status: Normal   Collection Time   04/04/12 12:37 PM      Component Value Range   Total CK 97  7 - 177 U/L   CK, MB 2.9  0.3 - 4.0 ng/mL   Troponin I <0.30  <0.30 ng/mL   Relative Index RELATIVE INDEX IS INVALID  0.0 - 2.5   Time spent in discharge (includes decision making & examination of pt): 30 minutes  04/04/2012, 3:20 PM   Lonia Blood, MD [facility] Office  [phone] Pager [phone]  On-Call/Text Page:      [email or web address]      password [facility]

## 2012-04-05 LAB — URINE CULTURE

## 2012-04-06 NOTE — Progress Notes (Signed)
Retro ur review 

## 2012-09-09 HISTORY — PX: WRIST FRACTURE SURGERY: SHX121

## 2013-05-05 ENCOUNTER — Other Ambulatory Visit: Payer: Self-pay | Admitting: Gastroenterology

## 2013-06-22 ENCOUNTER — Telehealth: Payer: Self-pay | Admitting: Interventional Cardiology

## 2013-06-22 NOTE — Telephone Encounter (Signed)
Please work pt into schedule either Thursday morning before 10am or on Friday. Thank You.

## 2013-06-22 NOTE — Telephone Encounter (Signed)
To Dr. Eldridge Dace, please advise. Nuclear stress test in 2013 was negative.

## 2013-06-22 NOTE — Telephone Encounter (Signed)
Can schedule appt. With me.

## 2013-06-22 NOTE — Telephone Encounter (Signed)
New Problem   Pt states that she is experiencing chest pains seldonmly... Has taken two nitros but she is still experiencing chest pains// please assist

## 2013-06-24 ENCOUNTER — Ambulatory Visit: Payer: Self-pay | Admitting: Interventional Cardiology

## 2013-07-28 ENCOUNTER — Other Ambulatory Visit (HOSPITAL_COMMUNITY)
Admission: RE | Admit: 2013-07-28 | Discharge: 2013-07-28 | Disposition: A | Discharge status: Home / Self Care | Payer: BC Managed Care – PPO | Source: Ambulatory Visit | Attending: Family Medicine | Admitting: Family Medicine

## 2013-07-28 ENCOUNTER — Other Ambulatory Visit: Payer: Self-pay | Admitting: Family Medicine

## 2013-07-28 DIAGNOSIS — Z124 Encounter for screening for malignant neoplasm of cervix: Secondary | ICD-10-CM | POA: Insufficient documentation

## 2013-09-24 ENCOUNTER — Telehealth: Payer: Self-pay | Admitting: Interventional Cardiology

## 2013-09-24 NOTE — Telephone Encounter (Signed)
Called and let them know questionnaire was received and we will fill out and fax back.

## 2013-09-24 NOTE — Telephone Encounter (Signed)
New message  Calling to verify a cardiac questionnaire that was received for this pt. Will fax another questionnaire.. Please complete and fax back//SR

## 2013-09-27 ENCOUNTER — Telehealth: Payer: Self-pay | Admitting: Interventional Cardiology

## 2013-09-27 NOTE — Telephone Encounter (Signed)
Advised that Dr. Irish Lack nor his CMA Amy is in office today.  Advised will route this note to them.

## 2013-09-27 NOTE — Telephone Encounter (Signed)
New Message  [facility] on behalf of [facility] called to follow up on Cardiac questionare was faxed for this pt on 01/16. Please fax back to     [phone] Attn: Carmell Austria

## 2013-09-28 NOTE — Telephone Encounter (Signed)
Questionaire has been filled out and it will be faxed today.

## 2013-11-30 ENCOUNTER — Encounter: Payer: Self-pay | Admitting: Cardiology

## 2013-11-30 ENCOUNTER — Ambulatory Visit (INDEPENDENT_AMBULATORY_CARE_PROVIDER_SITE_OTHER): Payer: BC Managed Care – PPO | Admitting: Interventional Cardiology

## 2013-11-30 ENCOUNTER — Encounter: Payer: Self-pay | Admitting: Interventional Cardiology

## 2013-11-30 ENCOUNTER — Telehealth: Payer: Self-pay | Admitting: Interventional Cardiology

## 2013-11-30 VITALS — BP 124/72 | HR 64 | Ht 62.0 in | Wt 196.1 lb

## 2013-11-30 DIAGNOSIS — R002 Palpitations: Secondary | ICD-10-CM

## 2013-11-30 DIAGNOSIS — E782 Mixed hyperlipidemia: Secondary | ICD-10-CM | POA: Insufficient documentation

## 2013-11-30 DIAGNOSIS — R079 Chest pain, unspecified: Secondary | ICD-10-CM

## 2013-11-30 DIAGNOSIS — I10 Essential (primary) hypertension: Secondary | ICD-10-CM

## 2013-11-30 MED ORDER — ENALAPRIL MALEATE 5 MG PO TABS: ORAL_TABLET | ORAL | Status: DC

## 2013-11-30 MED ORDER — ATENOLOL 25 MG PO TABS: ORAL_TABLET | ORAL | Status: DC

## 2013-11-30 MED ORDER — ATENOLOL 25 MG PO TABS: ORAL_TABLET | ORAL | Status: AC

## 2013-11-30 MED ORDER — ENALAPRIL MALEATE 5 MG PO TABS: ORAL_TABLET | ORAL | Status: AC

## 2013-11-30 NOTE — Telephone Encounter (Signed)
New Message:  Pt is c/o chest pain.. States she just took a nitro.. Pt states her BP last week was 170/124  And her heart rate was 121.. PT staets ever since then she has been sick on her stomach and been having chest pain on and off... Pt would like to speak to the nurse.

## 2013-11-30 NOTE — Telephone Encounter (Signed)
Patient called complaining of not feeling well times 1 week. She states that her BP was 170/124 last week with chest pain. She took NTG which helped but she still has generally not felt well lately. States that she has a history of tachycardia and HTN but states she has no history of CAD even though she has use of prn NTG.  Today she had some chest discomfort relieved with 2 NTG. Not sure of BP today because she is at work. No complaints of tachycardia today. Advised her to see Dr.Varanasi today at 130PM. She will go to the ER if symptoms worsen. Patient verbalized understanding.

## 2013-11-30 NOTE — Patient Instructions (Addendum)
Your physician has recommended you make the following change in your medication:   1. You can take an extra 5 mg of enalapril if systolic BP is above 563.   2. You can take an extra 1/2 tablet of atenolol 25 mg if you have palpitations.   Your physician wants you to follow-up in: 1 year with Dr. Irish Lack. You will receive a reminder letter in the mail two months in advance. If you don't receive a letter, please call our office to schedule the follow-up appointment.

## 2013-11-30 NOTE — Progress Notes (Signed)
Patient ID: [name], female   DOB: 08-06-1956, 58 y.o.   MRN: [id number]    [place], [place] [place], [place]  [address] Phone: [phone] Fax:  [phone]  Date:  11/30/2013   ID:  [name], DOB 1956/03/17, MRN [id number]  PCP:  Default, Provider, MD      History of Present Illness: [name] is a 58 y.o. female who has had a clean cath in the past. SHe has been having intermittent chest pain. Pains last a seconds to a few minutes.  She can have a chest heaviness as well.   No relation to walking. More likely when she is resting. Palpitations also occur after the chest discomfort. No relation to eating. Most strenuous activity is walking up stairs. No problems with this. Hypertension:  c/o Chest pain.  c/o Palpitations. Feels heart racing for a few minutes Denies : Dizziness.  Leg edema.  Paroxysmal nocturnal dyspnea.  Syncope.   Recently had a BP spike and fatigue.   Wt Readings from Last 3 Encounters:  11/30/13 196 lb 1.9 oz (88.959 kg)  04/03/12 197 lb 1.5 oz (89.4 kg)     Past Medical History  Diagnosis Date  . Hypertension   . Fibromyalgia   . Depression   . Arthritis   . Restless leg syndrome   . Chest pain   . Hyperlipidemia   . Hyperglycemia   . Anxiety   . PVD (peripheral vascular disease)   . DVT (deep venous thrombosis)   . Gallstones   . Arrhythmia     cardiac dysrhythmia, tachycardia  . GERD (gastroesophageal reflux disease)     Current Outpatient Prescriptions  Medication Sig Dispense Refill  . aspirin EC 81 MG tablet Take 81 mg by mouth daily.      Marland Kitchen atenolol (TENORMIN) 25 MG tablet Take 25 mg by mouth daily.      Marland Kitchen dexlansoprazole (DEXILANT) 60 MG capsule Take 60 mg by mouth daily.      . enalapril (VASOTEC) 5 MG tablet Take 5 mg by mouth daily.      Marland Kitchen EPINEPHrine (EPIPEN 2-PAK) 0.3 mg/0.3 mL SOAJ injection Inject into the muscle once.      . escitalopram (LEXAPRO) 10 MG tablet Take 10 mg by mouth daily.      .  nitroGLYCERIN (NITROSTAT) 0.4 MG SL tablet Place 0.4 mg under the tongue every 5 (five) minutes as needed for chest pain.      . Vitamin D, Ergocalciferol, (DRISDOL) 50000 UNITS CAPS Take 50,000 Units by mouth every 7 (seven) days.       No current facility-administered medications for this visit.    Allergies:    Allergies  Allergen Reactions  . Contrast Media [Iodinated Diagnostic Agents] Anaphylaxis    IV dyes  . Iohexol Shortness Of Breath    Social History:  The patient  reports that she has quit smoking. Her smoking use included Cigarettes. She smoked 0.00 packs per day for 27 years. She does not have any smokeless tobacco history on file. She reports that she does not drink alcohol or use illicit drugs.   Family History:  The patient's family history includes Hypertension in her mother; Hypothyroidism in her mother; Prostate cancer in her father.   ROS:  Please see the history of present illness.  No nausea, vomiting.  No fevers, chills.  No focal weakness.  No dysuria.    All other systems reviewed and negative.  PHYSICAL EXAM: VS:  BP 124/72  Pulse 64  Ht 5\' 2"  (1.575 m)  Wt 196 lb 1.9 oz (88.959 kg)  BMI 35.86 kg/m2 Well nourished, well developed, in no acute distress HEENT: normal Neck: no JVD, no carotid bruits Cardiac:  normal S1, S2; RRR;  Lungs:  clear to auscultation bilaterally, no wheezing, rhonchi or rales Abd: soft, nontender, no hepatomegaly Ext: no edema Skin: warm and dry Neuro:   no focal abnormalities noted  EKG:  NSR, rSR', anterior T wave changes, no change from 9/13    ASSESSMENT AND PLAN:  1.  CP: Atypical sx.  Negative cath many years ago.  She had a negative cardiolite in 9/13.  ECG was unchanged today.   Continue Aspir-81 Tablet Delayed Release, 81 MG, 1 tablet, Orally, Once a day Some DOE as well. Some atypical features. Her activity has been limited recently by her wrist surgery.   2.  Palpitations: She has worn a monitor in the past that  was negative in 2012. OK to use an extra half tab of atenolol for palpitations. 3.  HTN: Typically controlled.  One BP spike to the 568 systolic with some anxiety.  She can take an extra enalapril if needed if systolic> 127.  Continue Enalapril Tablet, 5 mg, 1 tablet, orally, daily Continue Atenolol Tablet, 25 MG, 1 tablet, Orally, Once a day Well controlled.  3. Mixed hyperlipidemia  LDL 133 at last check. Lifestyle modifcations. In 11/14: TG 296, LDL 97. Trying to improve with lifestyle changes 4. Abnormal EKG  Anterior T wave inversions-chronic. ETT would not be interpretable.    Signed, Mina Marble, MD, [facility] 11/30/2013 1:54 PM

## 2014-06-13 ENCOUNTER — Ambulatory Visit (INDEPENDENT_AMBULATORY_CARE_PROVIDER_SITE_OTHER): Payer: BC Managed Care – PPO | Admitting: Internal Medicine

## 2014-06-13 ENCOUNTER — Encounter: Payer: Self-pay | Admitting: Internal Medicine

## 2014-06-13 NOTE — Progress Notes (Addendum)
Patient ID: [name], female   DOB: Jun 30, 1956, 58 y.o.   MRN: [id number]   HPI  [name] is a 58 y.o.-year-old female, referred by her PCP, [name], in consultation for hypercalcemia.  Pt was dx with hypercalcemia when she presented to see PCP for fatigue, nausea, leg pains.  I reviewed pt's pertinent labs: 05/20/2014: Ca 11 (8.7-10.2), PTH 44 (15-65) 07/28/2013: Ca 10.4 Lab Results  Component Value Date   CALCIUM 9.2 04/04/2012   CALCIUM 10.0 04/03/2012   She broke her L wrist >> 2013 and L 5th metatarsal 01/2014.  No h/o kidney stones. Her mother and GF had kidney stones.   No h/o CKD. Last BUN/Cr: Lab Results  Component Value Date   BUN 13 04/04/2012   CREATININE 1.00 04/04/2012   Pt is not on HCTZ.  Last vit D 38.9 in 05/20/2014. She has h/o vitamin D deficiency. She was on Ergocalciferol 50,000 units 1-2 years ago, now on 5000 units daily.   Pt is on calcium and vitamin D; she also eats dairy and green, leafy, vegetables, but not every day.  She still has fatigue, but better. No AP. Has constipation. Some depression, but mostly anxiety (better on Lexapro).  Pt does not have a FH of hypercalcemia, pituitary tumors, thyroid cancer, or osteoporosis.   I reviewed her chart and she also has a history of fibromyalgia, HTN, HL, RLS.   ROS: Constitutional: + weight gain, + fatigue, + hot flushes, + poor sleep Eyes: + blurry vision, no xerophthalmia ENT: no sore throat, no nodules palpated in throat, no dysphagia/odynophagia, + hoarseness, + tinnitus, + hypoacusis Cardiovascular:+ CP/+ SOB/+ palpitations/no leg swelling Respiratory: no cough/+ SOB/+ wheezing Gastrointestinal: + N/+ V/no D/+ C Musculoskeletal: + muscle/+ joint aches Skin: no rashes, + easy bruising, + itching, + excessive hair growth Neurological: no tremors/numbness/tingling/dizziness, + HA Psychiatric: + depression/+ anxiety  Past Medical History  Diagnosis Date  . Hypertension   .  Fibromyalgia   . Depression   . Arthritis   . Restless leg syndrome   . Chest pain   . Hyperlipidemia   . Hyperglycemia   . Anxiety   . PVD (peripheral vascular disease)   . DVT (deep venous thrombosis)   . Gallstones   . Arrhythmia     cardiac dysrhythmia, tachycardia  . GERD (gastroesophageal reflux disease)    Past Surgical History  Procedure Laterality Date  . Wrist surgery    . Cardiac catheterization     History   Social History  . Marital Status: Single    Spouse Name: N/A    Number of Children: 2   Occupational History  . Consulting civil engineer   Social History Main Topics  . Smoking status: Former Smoker -- 27 years, quit in 2005    Types: Cigarettes  . Smokeless tobacco: No  . Alcohol Use: No  . Drug Use: No   Current Outpatient Prescriptions on File Prior to Visit  Medication Sig Dispense Refill  . aspirin EC 81 MG tablet Take 81 mg by mouth daily.      Marland Kitchen atenolol (TENORMIN) 25 MG tablet 1 tablet daily and an extra 1/2 tablet as needed for palpitations      . dexlansoprazole (DEXILANT) 60 MG capsule Take 60 mg by mouth daily.      . enalapril (VASOTEC) 5 MG tablet 1 tablet daily and an extra tablet if systolic BP is higher than 150      . EPINEPHrine (EPIPEN 2-PAK) 0.3  mg/0.3 mL SOAJ injection Inject into the muscle once.      . escitalopram (LEXAPRO) 10 MG tablet Take 10 mg by mouth daily.      . nitroGLYCERIN (NITROSTAT) 0.4 MG SL tablet Place 0.4 mg under the tongue every 5 (five) minutes as needed for chest pain.      . Vitamin D, Ergocalciferol, (DRISDOL) 50000 UNITS CAPS Take 50,000 Units by mouth every 7 (seven) days.       No current facility-administered medications on file prior to visit.   Allergies  Allergen Reactions  . Contrast Media [Iodinated Diagnostic Agents] Anaphylaxis    IV dyes  . Iohexol Shortness Of Breath   Family History  Problem Relation Age of Onset  . Hypertension Mother   . Hypothyroidism Mother   . Prostate cancer Father     PE: BP 118/68  Pulse 92  Temp(Src) 97.8 F (36.6 C) (Oral)  Resp 12  Ht 5\' 2"  (1.575 m)  Wt 198 lb (89.812 kg)  BMI 36.21 kg/m2  SpO2 96% Wt Readings from Last 3 Encounters:  06/13/14 198 lb (89.812 kg)  11/30/13 196 lb 1.9 oz (88.959 kg)  04/03/12 197 lb 1.5 oz (89.4 kg)   Constitutional: overweight, in NAD. No kyphosis. Eyes: PERRLA, EOMI, no exophthalmos ENT: moist mucous membranes, no thyromegaly, no cervical lymphadenopathy Cardiovascular: RRR, No MRG Respiratory: CTA B Gastrointestinal: abdomen soft, NT, ND, BS+ Musculoskeletal: no deformities, strength intact in all 4 Skin: moist, warm, no rashes Neurological: no tremor with outstretched hands, DTR normal in all 4  Assessment: 1. Hypercalcemia - inappropriately normal PTH - no vit D def now  Plan: Patient has had several instances of elevated calcium, with the highest level being at 11. A corresponding intact PTH level was normal (inappropriately) at 44.  No vitamin D deficiency. Has possible complications from hypercalcemia: abdominal pain, depression, some leg pain. No h/o nephrolithiasis, no osteoporosis, but had 2 fractures.  - I discussed with the patient about the physiology of calcium and parathyroid hormone, and possible side effects from increased PTH, including kidney stones, osteoporosis, abdominal pain, etc.  - We discussed that we need to check whether his hyperparathyroidism is primary (Familial hypercalcemic hypocalciuria or parathyroid adenoma) or secondary (to conditions like: vitamin D deficiency, calcium malabsorption, hypercalciuria, renal insufficiency, etc.). - we discussed about criteria for parathyroid surgery:  Increased calcium by more than 1 mg/dL above the upper limit of normal  Kidney ds.  Osteoporosis (or Vb fx) Age <62 years old New (2013): High UCa >400 mg/d and increased stone risk by biochemical stone risk analysis Presence of nephrolithiasis or nephrocalcinosis Pt's  preference!  - we may to check a DEXA scan to see if she has osteoporosis. I would add a 33% distal radius for evaluation of cortical bone.  - today we will check: Calcium and creatinine level intact PTH ([facility]) Magnesium Phosphorus vitamin D 1,25 HO  24h urinary calcium/creatinine ratio- pt given instructions for urine collection and the jug - If the tests indicate a parathyroid adenoma, I will refer her to surgery. We discussed possible consequences of hyperparathyroidism: ~1/3 pts will develop complications over 15 years (OP, nephrolithiasis). I will wait for the results of the above labs and will discuss with the plan with the patient.  - I will see the patient back in 4 months, I will discuss labs and further plan through my chart or by phone when labs are back   Component     Latest Ref Rng 06/13/2014  Sodium     135 - 145 mEq/L 140  Potassium     3.5 - 5.3 mEq/L 4.0  Chloride     96 - 112 mEq/L 106  CO2     19 - 32 mEq/L 23  Glucose     70 - 99 mg/dL 94  BUN     6 - 23 mg/dL 14  Creatinine     0.50 - 1.10 mg/dL 1.10  Calcium     8.4 - 10.5 mg/dL 10.1  Vitamin D 1, 25 (OH) Total     18 - 72 pg/mL 90 (H)  Vitamin D3 1, 25 (OH)      90  Vitamin D2 1, 25 (OH)      <8  Phosphorus     2.3 - 4.6 mg/dL 3.1  Magnesium     1.5 - 2.5 mg/dL 2.1  PTH     15 - 65 pg/mL 30   Component     Latest Ref Rng 06/15/2014  Calcium, Ur      14  Calcium, 24 hour urine     100 - 250 mg/day 308 (H)  Creatinine, Urine      77.7  Creatinine, 24H Ur     700 - 1800 mg/day 1709   Normal calcium, normal PTH. High 1,25 diHO vitamin D, normal phos and Mg. High 24h UCa. Labs not conclusive for primary hyperparathyroidism, but I still suspect that she may have this based on her previous high Ca levels with unsuppressed PTH, the high 1,25 diHO vit D and high urinary calcium. I will wait for the PTHrp and, if low, may need a Tc Sestamibi scan.  PTH rp normal, at 16 (14-27).  Since the  case is not definitive, I will check a Tc sestamibi scan before referring her to surgery.  CLINICAL DATA: Hypercalcemia. Evaluate for parathyroid adenoma.  EXAM: NM PARATHYROID SCINTIGRAPHY AND SPECT IMAGING  TECHNIQUE: Following intravenous administration of radiopharmaceutical, early and 2-hour delayed planar images were obtained in the anterior projection. Delayed triplanar SPECT images were also obtained at 2 hours.  RADIOPHARMACEUTICALS: 26.0 mCiTc-10m Sestamibi IV  COMPARISON: None  FINDINGS: On the early images there is radiotracer uptake throughout both lobes of the thyroid gland. On the delayed images there is washout of the radiopharmaceutical from both lobes of the thyroid gland. No specific features are identified to indicate parathyroid adenoma.  IMPRESSION: 1. Exam negative for parathyroid adenoma.   Electronically Signed By: Kerby Moors M.D. On: 07/13/2014 13:23              NM Tc sestamibi scan normal, but there can be false negative results with this test >> may need a 4D CT or neck U/S. I will suggest referral to surgery for a second opinion.  Pt agrees with the referral >> referral placed to see Dr. Harlow Asa.

## 2014-06-13 NOTE — Patient Instructions (Signed)
Please stop at the lab. Please return for another visit in 4 months.  Please collect a 24h urine sample and bring it to the lab. Patient information (Up-to-Date): Collection of a 24-hour urine specimen  - You should collect every drop of urine during each 24-hour period. It does not matter how much or little urine is passed each time, as long as every drop is collected. - Begin the urine collection in the morning after you wake up, after you have emptied your bladder for the first time. - Urinate (empty the bladder) for the first time and flush it down the toilet. Note the exact time (eg, 6:15 AM). You will begin the urine collection at this time. - Collect every drop of urine during the day and night in an empty collection bottle. Store the bottle at room temperature or in the refrigerator. - If you need to have a bowel movement, any urine passed with the bowel movement should be collected. Try not to include feces with the urine collection. If feces does get mixed in, do not try to remove the feces from the urine collection bottle. - Finish by collecting the first urine passed the next morning, adding it to the collection bottle. This should be within ten minutes before or after the time of the first morning void on the first day (which was flushed). In this example, you would try to void between 6:05 and 6:25 on the second day. - If you need to urinate one hour before the final collection time, drink a full glass of water so that you can void again at the appropriate time. If you have to urinate 20 minutes before, try to hold the urine until the proper time. - Please note the exact time of the final collection, even if it is not the same time as when collection began on day 1. - The bottle(s) may be kept at room temperature for a day or two, but should be kept cool or refrigerated for longer periods of time.  Please try to join MyChart for easier communication.

## 2014-06-14 LAB — BASIC METABOLIC PANEL
BUN: 14 mg/dL (ref 6–23)
CHLORIDE: 106 meq/L (ref 96–112)
CO2: 23 meq/L (ref 19–32)
CREATININE: 1.1 mg/dL (ref 0.50–1.10)
Calcium: 10.1 mg/dL (ref 8.4–10.5)
GLUCOSE: 94 mg/dL (ref 70–99)
POTASSIUM: 4 meq/L (ref 3.5–5.3)
Sodium: 140 mEq/L (ref 135–145)

## 2014-06-14 LAB — MAGNESIUM: MAGNESIUM: 2.1 mg/dL (ref 1.5–2.5)

## 2014-06-14 LAB — PHOSPHORUS: Phosphorus: 3.1 mg/dL (ref 2.3–4.6)

## 2014-06-15 ENCOUNTER — Other Ambulatory Visit: Payer: BC Managed Care – PPO

## 2014-06-15 LAB — PARATHYROID HORMONE, INTACT (NO CA): PTH: 30 pg/mL (ref 15–65)

## 2014-06-16 LAB — CREATININE, URINE, 24 HOUR
CREATININE, URINE: 77.7 mg/dL
Creatinine, 24H Ur: 1709 mg/d (ref 700–1800)

## 2014-06-16 LAB — VITAMIN D 1,25 DIHYDROXY
Vitamin D 1, 25 (OH)2 Total: 90 pg/mL — ABNORMAL HIGH (ref 18–72)
Vitamin D3 1, 25 (OH)2: 90 pg/mL

## 2014-06-16 LAB — CALCIUM, URINE, 24 HOUR
CALCIUM UR: 14 mg/dL
Calcium, 24 hour urine: 308 mg/d — ABNORMAL HIGH (ref 100–250)

## 2014-06-22 LAB — PTH-RELATED PEPTIDE: PTH-RELATED PROTEIN (PTH-RP): 16 pg/mL (ref 14–27)

## 2014-06-24 ENCOUNTER — Encounter: Payer: Self-pay | Admitting: Internal Medicine

## 2014-07-04 NOTE — Telephone Encounter (Signed)
Patient stated that [facility] have not contacted her . Who do she need to talk to.  Please advise

## 2014-07-04 NOTE — Telephone Encounter (Signed)
Pt has not heard anything from [facility] (or anyone else). Pt is suppose to have a scan of her Parathyroid. Please check into this for her and advise.

## 2014-07-13 ENCOUNTER — Encounter (HOSPITAL_COMMUNITY)
Admission: RE | Admit: 2014-07-13 | Discharge: 2014-07-13 | Disposition: A | Discharge status: Home / Self Care | Payer: BC Managed Care – PPO | Source: Ambulatory Visit | Attending: Internal Medicine | Admitting: Internal Medicine

## 2014-07-13 ENCOUNTER — Telehealth: Payer: Self-pay | Admitting: Interventional Cardiology

## 2014-07-13 MED ORDER — TECHNETIUM TC 99M SESTAMIBI GENERIC - CARDIOLITE
26.0000 | Freq: Once | INTRAVENOUS | Status: AC | PRN
  Administered 2014-07-13: 26 via INTRAVENOUS

## 2014-07-13 NOTE — Telephone Encounter (Signed)
1)C/o SOB that started more than 3 months ago but is now worsening, pt states that she is very sob after 10-20 steps. 2)C/o left arm pain that is random per pt, not necessarily when she is ambulating. 3)C/o Chest pain but states she has had that for along time and ignores that// she has not used nitro and had a normal LHC approx 2 yrs ago.  Denies LE edema but has had weight gain, pt has been going through thyroid issues so felt that may be the cause of wt gain.  No Echo in echart. I spoke with [facility] [employer]/ PA Samara Snide, pt is to be seen this week. Per scheduling, no available appointment today but may be seen tomorrow.  Pt was excepting of appointment 07/14/14 @10 :30 w PA Kathlen Mody.

## 2014-07-13 NOTE — Telephone Encounter (Signed)
LMTCB

## 2014-07-13 NOTE — Telephone Encounter (Signed)
New problem   Pt need to speak to nurse concerning some insures she is having. Please call pt.

## 2014-07-14 ENCOUNTER — Ambulatory Visit (INDEPENDENT_AMBULATORY_CARE_PROVIDER_SITE_OTHER): Payer: BC Managed Care – PPO | Admitting: Physician Assistant

## 2014-07-14 ENCOUNTER — Encounter: Payer: Self-pay | Admitting: Physician Assistant

## 2014-07-14 ENCOUNTER — Ambulatory Visit
Admission: RE | Admit: 2014-07-14 | Discharge: 2014-07-14 | Disposition: A | Discharge status: Home / Self Care | Payer: BC Managed Care – PPO | Source: Ambulatory Visit | Attending: Physician Assistant | Admitting: Physician Assistant

## 2014-07-14 ENCOUNTER — Telehealth: Payer: Self-pay | Admitting: Internal Medicine

## 2014-07-14 ENCOUNTER — Encounter: Payer: Self-pay | Admitting: Internal Medicine

## 2014-07-14 VITALS — BP 110/60 | HR 55 | Ht 62.0 in | Wt 198.0 lb

## 2014-07-14 DIAGNOSIS — R0600 Dyspnea, unspecified: Secondary | ICD-10-CM

## 2014-07-14 DIAGNOSIS — Z87891 Personal history of nicotine dependence: Secondary | ICD-10-CM

## 2014-07-14 DIAGNOSIS — R079 Chest pain, unspecified: Secondary | ICD-10-CM

## 2014-07-14 DIAGNOSIS — E782 Mixed hyperlipidemia: Secondary | ICD-10-CM

## 2014-07-14 DIAGNOSIS — I10 Essential (primary) hypertension: Secondary | ICD-10-CM

## 2014-07-14 LAB — BRAIN NATRIURETIC PEPTIDE: PRO B NATRI PEPTIDE: 31 pg/mL (ref 0.0–100.0)

## 2014-07-14 NOTE — Telephone Encounter (Signed)
Read below and advise.

## 2014-07-14 NOTE — Progress Notes (Signed)
Cardiology Office Note   Date:  07/14/2014   ID:  [name], DOB Apr 14, 1956, MRN [id number]  PCP:  Reginia Naas, MD  Cardiologist:  Dr. Casandra Doffing     History of Present Illness: [name] is a 58 y.o. female with a history of chest pain, palpitations, HTN, HL. Cardiac catheterization 2007 demonstrated normal coronary arteries. Nuclear study in 2013 was low risk. Event monitor in 2012 is unremarkable. Last seen by Dr. Irish Lack 11/2013. Patient called in yesterday with worsening dyspnea and left arm pain. She was added on to my schedule for further evaluation.  She is here by herself. She works in [employer] When school started back in August, she started to notice increasing dyspnea with walking short distances. She notes rapid palpitations with this. She has had several episodes with feeling disoriented while she is short of breath. She denies syncope. She has been sleeping on 2 pillows. She denies PND or LE edema. She has chronic chest pain. She describes it as a heaviness. She notices it with palpitations sometimes. She has had these symptoms for years. She thinks that they may be getting worse. She notes associated radiation to her back and left arm.  She gets discomfort in her chest at rest and with exertion.   Studies:  - LHC (3/07):  EF 60%, normal coronary arteries  - Nuclear (10/13):  Normal perfusion, EF 80%   Recent Labs/Images: 06/13/2014: BUN 14; Creatinine 1.10; Potassium 4.0; Sodium 140     Wt Readings from Last 3 Encounters:  07/14/14 198 lb (89.812 kg)  06/13/14 198 lb (89.812 kg)  11/30/13 196 lb 1.9 oz (88.959 kg)     Past Medical History  Diagnosis Date  . Hypertension   . Fibromyalgia   . Depression   . Arthritis   . Restless leg syndrome   . Chest pain   . Hyperlipidemia   . Hyperglycemia   . Anxiety   . PVD (peripheral vascular disease)   . DVT (deep venous thrombosis)   . Gallstones   . Arrhythmia     cardiac  dysrhythmia, tachycardia  . GERD (gastroesophageal reflux disease)     Current Outpatient Prescriptions  Medication Sig Dispense Refill  . aspirin EC 81 MG tablet Take 81 mg by mouth daily.    Marland Kitchen atenolol (TENORMIN) 25 MG tablet 1 tablet daily and an extra 1/2 tablet as needed for palpitations    . Cholecalciferol ([employer] VIT D 5000 HIGH-POTENCY) 5000 UNITS capsule Take 5,000 Units by mouth daily.    Marland Kitchen dexlansoprazole (DEXILANT) 60 MG capsule Take 60 mg by mouth daily.    . enalapril (VASOTEC) 5 MG tablet 1 tablet daily and an extra tablet if systolic BP is higher than 150    . EPINEPHrine (EPIPEN 2-PAK) 0.3 mg/0.3 mL SOAJ injection Inject into the muscle once.    . escitalopram (LEXAPRO) 10 MG tablet Take 10 mg by mouth daily.    . nitroGLYCERIN (NITROSTAT) 0.4 MG SL tablet Place 0.4 mg under the tongue every 5 (five) minutes as needed for chest pain.     No current facility-administered medications for this visit.     Allergies:   Contrast media and Iohexol   Social History:  The patient  reports that she has quit smoking. Her smoking use included Cigarettes. She smoked 0.00 packs per day for 27 years. She does not have any smokeless tobacco history on file. She reports that she does not drink alcohol or  use illicit drugs.   Family History:  The patient's family history includes Heart attack in her brother, maternal grandfather, maternal grandmother, and paternal grandfather; Hypertension in her mother; Hypothyroidism in her mother; Prostate cancer in her father. There is no history of Stroke.   ROS:  Please see the history of present illness.   She has indigestion. This is largely controlled on PPI therapy. She notes occasional wheezing. She denies significant cough.   All other systems reviewed and negative.    PHYSICAL EXAM: VS:  BP 110/60 mmHg  Pulse 55  Ht 5\' 2"  (1.575 m)  Wt 198 lb (89.812 kg)  BMI 36.21 kg/m2  SpO2 98% Well nourished, well developed, in no acute  distress HEENT: normal Neck:  no JVD Cardiac:  normal S1, S2;  RRR; no murmur Lungs:   clear to auscultation bilaterally, no wheezing, rhonchi or rales Abd: soft, nontender, no hepatomegaly Ext:  no edema Skin: warm and dry Neuro:  CNs 2-12 intact, no focal abnormalities noted  EKG:  Sinus bradycardia, HR 55, normal axis, diffuse ST abnormality with anterior T-wave inversions in V1, V2, RSR' in V1, no change from prior tracings      ASSESSMENT AND PLAN:  1.  Dyspnea:  Etiology not clear.  She has noted a significant change in her breathing over the past 3 mos. She does not look particularly volume overloaded on exam.  She has no hx of HF.  She had a normal cardiac cath in 2007 and non-ischemic myoview in 2013.  O2 sats 98% on RA in the office.  Well's Criteria are negative.  She does have a significant hx of smoking - she quit 10 years ago.    -  Check BNP.  Add Lasix if elevated.    -  Arrange 2D Echo to assess LV function, diastolic function, R heart pressures.    -  Arrange CXR.    -  Consider PFTs; referral to pulmonology if cardiac workup neg. 2.  Chest pain, unspecified chest pain type:  Symptoms are suggestive of angina.  However, she has had these for years and had a normal cath in the past.  No perfusion study has been done in 2 years. She has an abnormal EKG.  There have been no changes.    -  Check BNP, Echo, CXR as noted.    -  Arrange ETT-Myoview.      -  ? Syndrome X - Consider anti-anginal trial if work up not revealing.  Current BP may not tolerate nitrates or Amlodipine.  We could try (samples of) Ranexa. 3.  Essential hypertension:  Controlled.   4.  Mixed hyperlipidemia:  Statin intolerant. 5.  Former cigarette smoker:  COPD may cause her symptoms.  She smoked for 30 years at 1+ packs per day.  Consider PFTs and pulmonary eval if workup neg. 6.  Hypercalcemia:  FU with Endocrine as planned.   Disposition:   FU with Dr. Casandra Doffing or me in 2 weeks.     Signed, Versie Starks, MHS 07/14/2014 11:12 AM    [address], [place], [place]  [address] Phone: [phone]; Fax: [phone]

## 2014-07-14 NOTE — Patient Instructions (Signed)
LAB WORK TODAY; BNP  Your physician has requested that you have an echocardiogram. Echocardiography is a painless test that uses sound waves to create images of your heart. It provides your doctor with information about the size and shape of your heart and how well your heart's chambers and valves are working. This procedure takes approximately one hour. There are no restrictions for this procedure.  A chest x-ray AT [facility] takes a picture of the organs and structures inside the chest, including the heart, lungs, and blood vessels. This test can show several things, including, whether the heart is enlarges; whether fluid is building up in the lungs; and whether pacemaker / defibrillator leads are still in place.  Your physician recommends that you schedule a follow-up appointment in: [facility] [place] IS IN THE OFFICE

## 2014-07-14 NOTE — Addendum Note (Signed)
Addended by: Philemon Kingdom on: 07/14/2014 06:20 PM   Modules accepted: Orders, Level of Service

## 2014-07-14 NOTE — Telephone Encounter (Signed)
Patient states that she had received a Mychart message from Dr. Cruzita Lederer regarding her to see a specialist  Patient is ok with seeing one  [name] did not know how to reply to the message    Please advise   Thank you

## 2014-07-14 NOTE — Telephone Encounter (Signed)
Referral placed. She will get a call from Dr Gala Lewandowsky office.

## 2014-07-15 ENCOUNTER — Telehealth: Payer: Self-pay | Admitting: *Deleted

## 2014-07-15 NOTE — Telephone Encounter (Signed)
pt notified about cxr and lab results with verbal understanding

## 2014-07-15 NOTE — Telephone Encounter (Signed)
lmptcb for cxr and lab results

## 2014-07-15 NOTE — Telephone Encounter (Signed)
Left a message for pt on personalized vm.

## 2014-07-20 ENCOUNTER — Ambulatory Visit (HOSPITAL_BASED_OUTPATIENT_CLINIC_OR_DEPARTMENT_OTHER): Payer: BC Managed Care – PPO | Admitting: Radiology

## 2014-07-20 ENCOUNTER — Encounter: Payer: Self-pay | Admitting: Physician Assistant

## 2014-07-20 ENCOUNTER — Ambulatory Visit (HOSPITAL_COMMUNITY): Payer: BC Managed Care – PPO | Attending: Internal Medicine | Admitting: Radiology

## 2014-07-20 DIAGNOSIS — R079 Chest pain, unspecified: Secondary | ICD-10-CM | POA: Diagnosis not present

## 2014-07-20 DIAGNOSIS — R0600 Dyspnea, unspecified: Secondary | ICD-10-CM

## 2014-07-20 DIAGNOSIS — I10 Essential (primary) hypertension: Secondary | ICD-10-CM | POA: Diagnosis not present

## 2014-07-20 DIAGNOSIS — R002 Palpitations: Secondary | ICD-10-CM | POA: Diagnosis not present

## 2014-07-20 MED ORDER — TECHNETIUM TC 99M SESTAMIBI GENERIC - CARDIOLITE
33.0000 | Freq: Once | INTRAVENOUS | Status: AC | PRN
  Administered 2014-07-20: 33 via INTRAVENOUS

## 2014-07-20 MED ORDER — REGADENOSON 0.4 MG/5ML IV SOLN
0.4000 mg | Freq: Once | INTRAVENOUS | Status: AC
  Administered 2014-07-20: 0.4 mg via INTRAVENOUS

## 2014-07-20 MED ORDER — TECHNETIUM TC 99M SESTAMIBI GENERIC - CARDIOLITE
11.0000 | Freq: Once | INTRAVENOUS | Status: AC | PRN
  Administered 2014-07-20: 11 via INTRAVENOUS

## 2014-07-20 NOTE — Progress Notes (Signed)
Echocardiogram performed.  

## 2014-07-20 NOTE — Progress Notes (Signed)
[facility] [address] [place], [place] [address] [phone]    Cardiology Nuclear Med Study  [name] is a 58 y.o. female     MRN : [id number]     DOB: [birth year]  Procedure Date: 07/20/2014  Nuclear Med Background Indication for Stress Test:  Evaluation for Ischemia and Abnormal EKG History:  MPI 2013 (normal) EF 80% Cardiac Risk Factors: Hypertension  Symptoms:  Chest Pain, DOE, Palpitations and SOB   Nuclear Pre-Procedure Caffeine/Decaff Intake:  None NPO After: 5:30 pm  Lungs:  clear O2 Sat: 96% on room air. IV 0.9% NS with Angio Cath:  22g  IV Site: R Hand  IV Started by:  Crissie Figures, RN  Chest Size (in):  40 Cup Size: D  Height: 5\' 2"  (1.575 m)  Weight:  200 lb (90.719 kg)  BMI:  Body mass index is 36.57 kg/(m^2). Tech Comments:  Tenormin held>24 hrs    Nuclear Med Study 1 or 2 day study: 1 day  Stress Test Type:  Stress  Reading MD: N/A  Order Authorizing Provider:  Larae Grooms, MD  Resting Radionuclide: Technetium 22m Sestamibi  Resting Radionuclide Dose: 11.0 mCi   Stress Radionuclide:  Technetium 90m Sestamibi  Stress Radionuclide Dose: 33.0 mCi           Stress Protocol Rest HR: 65 Stress HR: 120  Rest BP: 128/85 Stress BP: 160/72  Exercise Time (min): n/a METS: n/a   Predicted Max HR: 165 bpm % Max HR: 72.73 bpm Rate Pressure Product: 19680   Dose of Adenosine (mg):  n/a Dose of Lexiscan: 0.4 mg  Dose of Atropine (mg): n/a Dose of Dobutamine: n/a mcg/kg/min (at max HR)  Stress Test Technologist: Glade Lloyd, BS-ES  Nuclear Technologist:  Earl Many, CNMT     Rest Procedure:  Myocardial perfusion imaging was performed at rest 45 minutes following the intravenous administration of Technetium 90m Sestamibi. Rest ECG: See below for description.  Stress Procedure:  The patient received IV Lexiscan 0.4 mg over 15-seconds.  Technetium 80m Sestamibi injected at 30-seconds.  Quantitative spect images  were obtained after a 45 minute delay.  Attempted to walk patient on Bruce Protocol but the patient became very SOB and lightheaded.  Had to stop and sit down.  Change to low level Lexiscan.  During the infusion the patient complained of tingling all over, dizziness, chest heaviness, leg heaviness and headache.  These symptoms began to resolve in recovery.  Stress ECG: See below for description  QPS Raw Data Images:  Normal; no motion artifact; normal heart/lung ratio. Stress Images:  Small, mild apical anterior and apical inferolateral perfusion defects. Rest Images:  Small, mild apical anterior and apical inferolateral perfusion defects. Subtraction (SDS):  Fixed small, mild apical anterior and apical inferolateral perfusion defects. Transient Ischemic Dilatation (Normal <1.22):  0.85 Lung/Heart Ratio (Normal <0.45):  0.37  Quantitative Gated Spect Images QGS EDV:  84 ml QGS ESV:  25 ml  Impression Exercise Capacity:  Lexiscan with no exercise. BP Response:  Normal blood pressure response. Clinical Symptoms:  Short of breath, lightheaded. ECG Impression:  Baseline anterior T wave inversions and slight ST depression in V3-V4.  During stress, patient developed 1 mm ST depression in V3-V5 Comparison with Prior Nuclear Study: No images to compare  Overall Impression:  Low risk stress nuclear study with fixed, small mild apical anterior and apical inferolateral perfusion defects.  Suspect soft tissue attenuation given normal wall motion.  No  ischemia.  Nonspecific ECG response with baseline abnormalities. .  LV Ejection Fraction: 71%.  LV Wall Motion:  NL LV Function; NL Wall Motion   [name] 07/20/2014

## 2014-07-21 ENCOUNTER — Encounter: Payer: Self-pay | Admitting: Physician Assistant

## 2014-07-21 ENCOUNTER — Telehealth: Payer: Self-pay | Admitting: *Deleted

## 2014-07-21 NOTE — Telephone Encounter (Signed)
lmptcb fro results

## 2014-07-21 NOTE — Telephone Encounter (Signed)
pt notified about echo results with verbal understanding, verified f/u appt w/Scott W. PA 11/30.

## 2014-07-21 NOTE — Telephone Encounter (Signed)
New message  Pt call for results

## 2014-07-21 NOTE — Telephone Encounter (Signed)
Follow up//sr   Pt has to step out in 10 minutes req a call back soon

## 2014-07-22 ENCOUNTER — Telehealth: Payer: Self-pay | Admitting: *Deleted

## 2014-07-22 NOTE — Telephone Encounter (Signed)
lmptcb for myoview results 

## 2014-07-22 NOTE — Telephone Encounter (Signed)
pt notified about myoview results with verbal understanding 

## 2014-08-08 ENCOUNTER — Ambulatory Visit: Payer: Self-pay | Admitting: Physician Assistant

## 2014-08-10 ENCOUNTER — Other Ambulatory Visit (INDEPENDENT_AMBULATORY_CARE_PROVIDER_SITE_OTHER): Payer: Self-pay

## 2014-08-10 DIAGNOSIS — E213 Hyperparathyroidism, unspecified: Secondary | ICD-10-CM

## 2014-08-12 ENCOUNTER — Ambulatory Visit
Admission: RE | Admit: 2014-08-12 | Discharge: 2014-08-12 | Disposition: A | Discharge status: Home / Self Care | Payer: BC Managed Care – PPO | Source: Ambulatory Visit | Attending: Surgery | Admitting: Surgery

## 2014-08-12 DIAGNOSIS — E213 Hyperparathyroidism, unspecified: Secondary | ICD-10-CM

## 2014-08-15 ENCOUNTER — Telehealth (INDEPENDENT_AMBULATORY_CARE_PROVIDER_SITE_OTHER): Payer: Self-pay

## 2014-08-15 ENCOUNTER — Other Ambulatory Visit (INDEPENDENT_AMBULATORY_CARE_PROVIDER_SITE_OTHER): Payer: Self-pay

## 2014-08-15 DIAGNOSIS — E042 Nontoxic multinodular goiter: Secondary | ICD-10-CM

## 2014-08-15 NOTE — Telephone Encounter (Signed)
USN result in epic. Msg to Dr Harlow Asa to review and call pt with recommendation.

## 2014-11-18 ENCOUNTER — Telehealth: Payer: Self-pay | Admitting: Internal Medicine

## 2014-11-18 NOTE — Telephone Encounter (Signed)
Pt PCP told pt to call and needs to see Dr. Gardenia Phlegm  Because blood work came back and calcium level has gone up

## 2014-11-18 NOTE — Telephone Encounter (Signed)
Called pt and lvm advising her to call back and schedule an appt to be seen by Dr Cruzita Lederer at her convenience.

## 2014-11-21 ENCOUNTER — Ambulatory Visit (INDEPENDENT_AMBULATORY_CARE_PROVIDER_SITE_OTHER): Payer: BC Managed Care – PPO | Admitting: Internal Medicine

## 2014-11-21 ENCOUNTER — Encounter: Payer: Self-pay | Admitting: Internal Medicine

## 2014-11-21 NOTE — Progress Notes (Addendum)
Patient ID: [name], female   DOB: 08-21-1956, 58 y.o.   MRN: [id number]   HPI  [name] is a 59 y.o.-year-old female, returning for follow-up for hypercalcemia.  Reviewed and addended history: Pt was dx with hypercalcemia when she presented to see PCP for fatigue, nausea, leg pains.  I reviewed pt's pertinent labs: 11/16/2014: Ca 11.3 05/20/2014: Ca 11 (8.7-10.2), PTH 44 (15-65) 07/28/2013: Ca 10.4 Lab Results  Component Value Date   PTH 30 06/13/2014   CALCIUM 10.1 06/13/2014   CALCIUM 9.2 04/04/2012   CALCIUM 10.0 04/03/2012   She broke her L wrist >> 2013 and L 5th metatarsal 01/2014.  No h/o kidney stones. Her mother and GF had kidney stones.   No h/o CKD. Last BUN/Cr: Lab Results  Component Value Date   BUN 14 06/13/2014   CREATININE 1.10 06/13/2014   Pt is not on HCTZ.  Last vit D 38.9 in 05/20/2014. She has h/o vitamin D deficiency. She was on Ergocalciferol 50,000 units 1-2 years ago, now on 5000 units daily.   Patient continues to have fatigue, improved. No AP. Has constipation. Some depression, but mostly anxiety (better on Lexapro).  At last visit, we investigated her for primary hyperparathyroidism with the following results: - Calcium was normal, at 10.1, however it was higher as checked by PCP afterwards (see above) - PTH were normal at 30 - 1,25 dihydroxy vitamin D was high, at 90 - Urinary calcium was high at 308  Due to high suspicion for parathyroid adenoma, I checked a sestamibi scan of the parathyroid glands, and this was normal.  Patient then agreed to be referred to surgery for a second opinion. Dr. Harlow Asa ordered a cervical ultrasound, that returned negative for parathyroid adenoma. Surgery was not considered necessary since an adenoma could be found on either the sestamibi scan or the ultrasound.  As mentioned above, since then, patient had increasingly high calcium levels per PCP, so she was referred back to me.  She also has a  history of fibromyalgia, HTN, HL, RLS.   ROS: Constitutional: no weight gain, + fatigue, + hot flushes, + poor sleep Eyes: + blurry vision, no xerophthalmia ENT: no sore throat, no nodules palpated in throat, no dysphagia/odynophagia, + hoarseness, + tinnitus, + hypoacusis Cardiovascular:+ CP/+ SOB/+ palpitations/no leg swelling Respiratory: no cough/+ SOB/+ wheezing Gastrointestinal: no N/V/nD/+ C Musculoskeletal: + muscle/+ joint aches Skin: no rashes, + easy bruising Neurological: no tremors/numbness/tingling/dizziness, + HA  I reviewed pt's medications, allergies, PMH, social hx, family hx, and changes were documented in the history of present illness. Otherwise, unchanged from my initial visit note:  Past Medical History  Diagnosis Date  . Hypertension   . Fibromyalgia   . Depression   . Arthritis   . Restless leg syndrome   . Chest pain   . Hyperlipidemia   . Hyperglycemia   . Anxiety   . PVD (peripheral vascular disease)   . DVT (deep venous thrombosis)   . Gallstones   . Arrhythmia     cardiac dysrhythmia, tachycardia  . GERD (gastroesophageal reflux disease)   . Hx of echocardiogram     Echo (11/15):  Mild LVH, EF 55-60%, GLPSS mildly reduced (-16%), Gr 1 DD  . Hx of echocardiogram     Lexiscan Myoview (11/15):  Low risk; Fixed, small mild apical anterior and apical inferolateral perfusion defects. Suspect soft tissue attenuation given normal wall motion. No ischemia. EF 71%.   Past Surgical History  Procedure Laterality Date  .  Wrist surgery    . Cardiac catheterization     History   Social History  . Marital Status: Single    Spouse Name: N/A    Number of Children: 2   Occupational History  . Consulting civil engineer   Social History Main Topics  . Smoking status: Former Smoker -- 27 years, quit in 2005    Types: Cigarettes  . Smokeless tobacco: No  . Alcohol Use: No  . Drug Use: No   Current Outpatient Prescriptions on File Prior to Visit   Medication Sig Dispense Refill  . aspirin EC 81 MG tablet Take 81 mg by mouth daily.    Marland Kitchen atenolol (TENORMIN) 25 MG tablet 1 tablet daily and an extra 1/2 tablet as needed for palpitations    . Cholecalciferol ([employer] VIT D 5000 HIGH-POTENCY) 5000 UNITS capsule Take 5,000 Units by mouth daily.    Marland Kitchen dexlansoprazole (DEXILANT) 60 MG capsule Take 60 mg by mouth daily.    . enalapril (VASOTEC) 5 MG tablet 1 tablet daily and an extra tablet if systolic BP is higher than 150    . EPINEPHrine (EPIPEN 2-PAK) 0.3 mg/0.3 mL SOAJ injection Inject into the muscle once.    . escitalopram (LEXAPRO) 10 MG tablet Take 10 mg by mouth daily.    . nitroGLYCERIN (NITROSTAT) 0.4 MG SL tablet Place 0.4 mg under the tongue every 5 (five) minutes as needed for chest pain.     No current facility-administered medications on file prior to visit.   Allergies  Allergen Reactions  . Contrast Media [Iodinated Diagnostic Agents] Anaphylaxis    IV dyes  . Iohexol Shortness Of Breath   Family History  Problem Relation Age of Onset  . Hypertension Mother   . Hypothyroidism Mother   . Prostate cancer Father   . Heart attack Brother   . Heart attack Maternal Grandmother   . Heart attack Maternal Grandfather   . Heart attack Paternal Grandfather   . Stroke Neg Hx    PE: BP 118/70 mmHg  Pulse 98  Temp(Src) 98.3 F (36.8 C) (Oral)  Resp 12  Wt 200 lb (90.719 kg)  SpO2 95% Body mass index is 36.57 kg/(m^2).  Wt Readings from Last 3 Encounters:  11/21/14 200 lb (90.719 kg)  07/20/14 200 lb (90.719 kg)  07/14/14 198 lb (89.812 kg)   Constitutional: overweight, in NAD. No kyphosis. Eyes: PERRLA, EOMI, no exophthalmos ENT: moist mucous membranes, no thyromegaly, no cervical lymphadenopathy Cardiovascular: RRR, No MRG Respiratory: CTA B Gastrointestinal: abdomen soft, NT, ND, BS+ Musculoskeletal: no deformities, strength intact in all 4 Skin: moist, warm, no rashes Neurological: no tremor with outstretched  hands, DTR normal in all 4  Assessment: 1. Hypercalcemia - inappropriately normal PTH - no vit D def now  07/13/2014: NM Tc sestamibi scan: normal  - PTH rp normal, at 16 (14-27).  Component     Latest Ref Rng 06/13/2014  Sodium     135 - 145 mEq/L 140  Potassium     3.5 - 5.3 mEq/L 4.0  Chloride     96 - 112 mEq/L 106  CO2     19 - 32 mEq/L 23  Glucose     70 - 99 mg/dL 94  BUN     6 - 23 mg/dL 14  Creatinine     0.50 - 1.10 mg/dL 1.10  Calcium     8.4 - 10.5 mg/dL 10.1  Vitamin D 1, 25 (OH) Total     18 -  72 pg/mL 90 (H)  Vitamin D3 1, 25 (OH)      90  Vitamin D2 1, 25 (OH)      <8  Phosphorus     2.3 - 4.6 mg/dL 3.1  Magnesium     1.5 - 2.5 mg/dL 2.1  PTH     15 - 65 pg/mL 30   Component     Latest Ref Rng 06/15/2014  Calcium, Ur      14  Calcium, 24 hour urine     100 - 250 mg/day 308 (H)  Creatinine, Urine      77.7  Creatinine, 24H Ur     700 - 1800 mg/day 1709   Normal calcium, normal PTH. High 1,25 diHO vitamin D, normal phos and Mg. High 24h UCa. Labs not conclusive for primary hyperparathyroidism, but I still suspect that she may have this based on her previous high Ca levels with unsuppressed PTH, the high 1,25 diHO vit D and high urinary calcium.  08/12/2014: Thyroid U/S: Solitary small nodule in the right lobe measuring 4 mm. No parathyroid adenoma  Plan: This was mostly a counseling session, in which we discussed about her previous investigation, both blood work, urine tests and imaging tests. I explained that the clinical and lab picture is indicative of a parathyroid adenoma, however, there is no associated lesion on the imaging tests.   At this point, we have the option of continuing to follow her, which is not quite ideal since her last calcium was 11.3.   Another option is to refer her to one of the academic medical centers nearby. I don't think we can perform a 4D CT here... I tried to order it, but was not able to.  Another option  would be to check a DEXA scan and, if low, to start a bisphosphonate, which will help both her bones and lowering the calcium. This is probably the least desirable, since it does not eliminate the source of high calcium. - For now, I will repeat her calcium and PTH  Office Visit on 11/21/2014  Component Date Value Ref Range Status  . Calcium 11/21/2014 10.3* 8.7 - 10.2 mg/dL Final  . PTH 11/21/2014 32  15 - 65 pg/mL Final  . PTH 11/21/2014 Comment   Final   Comment: Interpretation                 Intact PTH    Calcium                                 (pg/mL)      (mg/dL) Normal                          15 - 65     8.6 - 10.2 Primary Hyperparathyroidism         >65          >10.2 Secondary Hyperparathyroidism       >65          <10.2 Non-Parathyroid Hypercalcemia       <65          >10.2 Hypoparathyroidism                  <15          < 8.6 Non-Parathyroid Hypocalcemia    15 - 65          <  8.6    Calcium slightly high, PTH close to the lower limit of normal.  I curbsided one of my colleagues about other possible directions for diagnosis and treatment, but there is not much to do at this point, without an apparent lesion on imaging scans. I advised the patient to stay hydrated, and will keep an eye on her calcium levels. In the meantime, I would like to perform a DEXA scan. We will also check a radius BMD. I will addend the results when they become available.   Date of study: 12/05/14 Exam: DUAL X-RAY ABSORPTIOMETRY (DXA) FOR BONE MINERAL DENSITY (BMD) Instrument: [employer] Requesting Provider: PCP Indication: follow up for low BMD Comparison: none (please note that it is not possible to compare data from different instruments) Clinical data: Pt is a postmenopausal 59 y.o. female with previous h/o fracture. On calcium and vitamin D.  Results:  Lumbar spine (L1-L4) Femoral neck (FN) 33% distal radius  T-score -1.9 RFN:-1.3 LFN:-2.0 -2.8  Change in BMD from previous  DXA test (%) n/a n/a n/a  (*) statistically significant  Assessment: the BMD is low according to the [facility] classification for osteoporosis (see below). Fracture risk: moderate FRAX score: 10 year major osteoporotic risk: 13.3%. 10 year hip fracture risk: 1.7%. These are under the thresholds for treatment of 20% and 3%, respectively. Comments: the technical quality of the study is good. Evaluation for secondary causes should be considered if clinically indicated.  Recommend optimizing calcium (1200 mg/day) and vitamin D (800 IU/day) intake.  Followup: Repeat BMD is appropriate after 2 years or after 1-2 years if starting treatment.  WHO criteria for diagnosis of osteoporosis in postmenopausal women and in men 28 y/o or older:  - normal: T-score -1.0 to + 1.0 - osteopenia/low bone density: T-score between -2.5 and -1.0 - osteoporosis: T-score below -2.5 - severe osteoporosis: T-score below -2.5 with history of fragility fracture Note: although not part of the WHO classification, the presence of a fragility fracture, regardless of the T-score, should be considered diagnostic of osteoporosis, provided other causes for the fracture have been excluded.  Treatment: The National Osteoporosis Foundation recommends that treatment be considered in postmenopausal women and men age 17 or older with: 1. Hip or vertebral (clinical or morphometric) fracture 2. T-score of - 2.5 or lower at the spine or hip 3. 10-year fracture probability by FRAX of at least 20% for a major osteoporotic fracture and 3% for a hip fracture  Loura Pardon MD       Patient with osteopenia at lumbar spine and femoral neck, however osteoporosis 33% distal radius. This is expected in the setting of hyperparathyroidism. The problem is that our imaging studies did not localize it. I will forward the results to Dr. Harlow Asa.

## 2014-11-21 NOTE — Patient Instructions (Signed)
Please stop at the lab.  Please come back for a follow-up appointment in 6 months.  Please stay hydrated.

## 2014-11-22 LAB — PTH, INTACT AND CALCIUM
Calcium: 10.3 mg/dL — ABNORMAL HIGH (ref 8.7–10.2)
PTH: 32 pg/mL (ref 15–65)

## 2014-11-24 ENCOUNTER — Telehealth: Payer: Self-pay | Admitting: *Deleted

## 2014-11-24 NOTE — Telephone Encounter (Signed)
Called and scheduled pt's DEXA (with radius) on Mon, March 28th at 10:30. Called pt and advised her. Advised to arrive 15 min early. Be advised.

## 2014-12-05 ENCOUNTER — Ambulatory Visit (INDEPENDENT_AMBULATORY_CARE_PROVIDER_SITE_OTHER)
Admission: RE | Admit: 2014-12-05 | Discharge: 2014-12-05 | Disposition: A | Discharge status: Home / Self Care | Payer: BC Managed Care – PPO | Source: Ambulatory Visit | Attending: Internal Medicine | Admitting: Internal Medicine

## 2014-12-14 ENCOUNTER — Encounter: Payer: Self-pay | Admitting: Internal Medicine

## 2014-12-14 ENCOUNTER — Telehealth: Payer: Self-pay | Admitting: Internal Medicine

## 2014-12-14 NOTE — Addendum Note (Signed)
Addended by: Philemon Kingdom on: 12/14/2014 01:03 PM   Modules accepted: Level of Service

## 2014-12-14 NOTE — Telephone Encounter (Signed)
Called pt and lvm advising her per Dr Cruzita Lederer that her bone density results were released to her through [facility]. Advised her to call with any questions.

## 2014-12-14 NOTE — Telephone Encounter (Signed)
Patient called and would like to know if her bone density results are available   Please advise patient    Thank you

## 2014-12-14 NOTE — Telephone Encounter (Signed)
I just sent her the results through my chart.

## 2014-12-14 NOTE — Telephone Encounter (Signed)
Pt would like you to call her back so she can go over what her decision is. She has made an appt with Dr. Harlow Asa regarding parathyroid.  Pt asking [place] to call [phone]

## 2014-12-14 NOTE — Telephone Encounter (Signed)
Please read message below and advise.  

## 2014-12-20 ENCOUNTER — Encounter: Payer: Self-pay | Admitting: Internal Medicine

## 2014-12-20 ENCOUNTER — Ambulatory Visit (INDEPENDENT_AMBULATORY_CARE_PROVIDER_SITE_OTHER): Payer: BC Managed Care – PPO | Admitting: Internal Medicine

## 2014-12-20 DIAGNOSIS — M8580 Other specified disorders of bone density and structure, unspecified site: Secondary | ICD-10-CM

## 2014-12-20 NOTE — Patient Instructions (Signed)
Please keep the appt with Dr. Harlow Asa on 01/04/2015.  Please come back for a follow-up appointment in 6 months.

## 2014-12-20 NOTE — Progress Notes (Signed)
Patient ID: [name], female   DOB: 1956-02-11, 59 y.o.   MRN: [id number]   HPI  [name] is a 59 y.o.-year-old female, returning for follow-up for hypercalcemia. Last visit a month ago. This is mostly a counseling session to discuss the latest results and design a plan of action for her hypercalcemia and osteopenia.  She saw PCP in 1 week for L flank pressure and sharp pain >> Urine culture did not grow an organism. PCP believed she may have a kidney stone, no imaging studies done yet. She does not have intense pain.   Has appt with Dr. Harlow Asa on 12/30/2014.  Reviewed and addended history: Pt was dx with hypercalcemia when she presented to see PCP for fatigue, nausea, leg pains.  I reviewed pt's pertinent labs: Lab Results  Component Value Date   PTH 32 11/21/2014   PTH Comment 11/21/2014   PTH 30 06/13/2014   CALCIUM 10.3* 11/21/2014   CALCIUM 10.1 06/13/2014   CALCIUM 9.2 04/04/2012   CALCIUM 10.0 04/03/2012  11/16/2014: Ca 11.3 05/20/2014: Ca 11 (8.7-10.2), PTH 44 (15-65) 07/28/2013: Ca 10.4  She broke her L wrist >> 2013 and L 5th metatarsal 01/2014.  No h/o kidney stones. Her mother and GF had kidney stones.   No h/o CKD. Last BUN/Cr: Lab Results  Component Value Date   BUN 14 06/13/2014   CREATININE 1.10 06/13/2014   Pt is not on HCTZ.  Last vit D 38.9 in 05/20/2014. She has h/o vitamin D deficiency. She was on Ergocalciferol 50,000 units 1-2 years ago, now on 5000 units daily.   Patient continues to have fatigue, improved. No AP. Has constipation. Some depression, but mostly anxiety (better on Lexapro).  We have previously  investigated her for primary hyperparathyroidism with the following results: - Calcium was normal, at 10.1, however it was higher as checked by PCP afterwards (see above). - PTH were normal at 30 - 1,25 dihydroxy vitamin D was high, at 90 - Urinary calcium was high at 308  Due to high suspicion for parathyroid adenoma, I checked  a sestamibi scan of the parathyroid glands, and this was normal:  Patient then agreed to be referred to surgery for a second opinion. Dr. Harlow Asa ordered a cervical ultrasound, that returned negative for parathyroid adenoma. Surgery was not considered necessary since an adenoma could be found on either the sestamibi scan or the ultrasound.  As mentioned above, since then, patient had increasingly high calcium levels per PCP, so she was referred back to me.  At last visit,  I repeated her calcium and this was a little high, in the setting of a nonsuppressed PTH: PTH 32 11/21/2014  CALCIUM 10.3* 11/21/2014   We checked a DEXA scan - osteopenia except osteoporosis-range T score at 33% distal radius:  12/11/2014  Lumbar spine (L1-L4) Femoral neck (FN) 33% distal radius  T-score -1.9 RFN:-1.3 LFN:-2.0 -2.8   She also has a history of fibromyalgia, HTN, HL, RLS.   ROS: Constitutional: no weight gain, + fatigue, + hot flushes, + poor sleep Eyes: + blurry vision, no xerophthalmia ENT: no sore throat, no nodules palpated in throat, no dysphagia/odynophagia, + hoarseness, + tinnitus, + hypoacusis Cardiovascular: noCP/SOB/palpitations/no leg swelling Respiratory: no cough/SOB/wheezing Gastrointestinal: no N/V/D/C Musculoskeletal: no muscle/+ joint aches Skin: no rashes Neurological: no tremors/numbness/tingling/dizziness, + HA  I reviewed pt's medications, allergies, PMH, social hx, family hx, and changes were documented in the history of present illness. Otherwise, unchanged from my initial visit note:  Past Medical History  Diagnosis Date  . Hypertension   . Fibromyalgia   . Depression   . Arthritis   . Restless leg syndrome   . Chest pain   . Hyperlipidemia   . Hyperglycemia   . Anxiety   . PVD (peripheral vascular disease)   . DVT (deep venous thrombosis)   . Gallstones   . Arrhythmia     cardiac dysrhythmia, tachycardia  . GERD (gastroesophageal reflux disease)   . Hx  of echocardiogram     Echo (11/15):  Mild LVH, EF 55-60%, GLPSS mildly reduced (-16%), Gr 1 DD  . Hx of echocardiogram     Lexiscan Myoview (11/15):  Low risk; Fixed, small mild apical anterior and apical inferolateral perfusion defects. Suspect soft tissue attenuation given normal wall motion. No ischemia. EF 71%.   Past Surgical History  Procedure Laterality Date  . Wrist surgery    . Cardiac catheterization     History   Social History  . Marital Status: Single    Spouse Name: N/A    Number of Children: 2   Occupational History  . Consulting civil engineer   Social History Main Topics  . Smoking status: Former Smoker -- 27 years, quit in 2005    Types: Cigarettes  . Smokeless tobacco: No  . Alcohol Use: No  . Drug Use: No   Current Outpatient Prescriptions on File Prior to Visit  Medication Sig Dispense Refill  . aspirin EC 81 MG tablet Take 81 mg by mouth daily.    Marland Kitchen atenolol (TENORMIN) 25 MG tablet 1 tablet daily and an extra 1/2 tablet as needed for palpitations    . Cholecalciferol ([employer] VIT D 5000 HIGH-POTENCY) 5000 UNITS capsule Take 5,000 Units by mouth daily.    Marland Kitchen dexlansoprazole (DEXILANT) 60 MG capsule Take 60 mg by mouth daily.    . enalapril (VASOTEC) 5 MG tablet 1 tablet daily and an extra tablet if systolic BP is higher than 150    . EPINEPHrine (EPIPEN 2-PAK) 0.3 mg/0.3 mL SOAJ injection Inject into the muscle once.    . escitalopram (LEXAPRO) 10 MG tablet Take 10 mg by mouth daily.    . nitroGLYCERIN (NITROSTAT) 0.4 MG SL tablet Place 0.4 mg under the tongue every 5 (five) minutes as needed for chest pain.     No current facility-administered medications on file prior to visit.   Allergies  Allergen Reactions  . Contrast Media [Iodinated Diagnostic Agents] Anaphylaxis    IV dyes  . Iohexol Shortness Of Breath   Family History  Problem Relation Age of Onset  . Hypertension Mother   . Hypothyroidism Mother   . Prostate cancer Father   . Heart attack  Brother   . Heart attack Maternal Grandmother   . Heart attack Maternal Grandfather   . Heart attack Paternal Grandfather   . Stroke Neg Hx    PE: BP 112/62 mmHg  Pulse 66  Temp(Src) 97.7 F (36.5 C) (Oral)  Resp 12  Wt 198 lb 9.6 oz (90.084 kg)  SpO2 98% Body mass index is 36.32 kg/(m^2).  Wt Readings from Last 3 Encounters:  12/20/14 198 lb 9.6 oz (90.084 kg)  11/21/14 200 lb (90.719 kg)  07/20/14 200 lb (90.719 kg)   Constitutional: overweight, in NAD. No kyphosis. Eyes: PERRLA, EOMI, no exophthalmos ENT: moist mucous membranes, no thyromegaly, no cervical lymphadenopathy Cardiovascular: RRR, No MRG Respiratory: CTA B Gastrointestinal: abdomen soft, NT, ND, BS+ Musculoskeletal: no deformities, strength intact in all 4  Skin: moist, warm, no rashes Neurological: no tremor with outstretched hands, DTR normal in all 4  Assessment: 1. Hypercalcemia - inappropriately normal PTH - no vit D def now  07/13/2014: NM Tc sestamibi scan: normal  - PTH rp normal, at 16 (14-27).  Component     Latest Ref Rng 06/13/2014  Sodium     135 - 145 mEq/L 140  Potassium     3.5 - 5.3 mEq/L 4.0  Chloride     96 - 112 mEq/L 106  CO2     19 - 32 mEq/L 23  Glucose     70 - 99 mg/dL 94  BUN     6 - 23 mg/dL 14  Creatinine     0.50 - 1.10 mg/dL 1.10  Calcium     8.4 - 10.5 mg/dL 10.1  Vitamin D 1, 25 (OH) Total     18 - 72 pg/mL 90 (H)  Vitamin D3 1, 25 (OH)      90  Vitamin D2 1, 25 (OH)      <8  Phosphorus     2.3 - 4.6 mg/dL 3.1  Magnesium     1.5 - 2.5 mg/dL 2.1  PTH     15 - 65 pg/mL 30   Component     Latest Ref Rng 06/15/2014  Calcium, Ur      14  Calcium, 24 hour urine     100 - 250 mg/day 308 (H)  Creatinine, Urine      77.7  Creatinine, 24H Ur     700 - 1800 mg/day 1709   Normal calcium, normal PTH. High 1,25 diHO vitamin D, normal phos and Mg. High 24h UCa. Labs not conclusive for primary hyperparathyroidism, but I still suspect that she may have this  based on her previous high Ca levels with unsuppressed PTH, the high 1,25 diHO vit D and high urinary calcium.  08/12/2014: Thyroid U/S: Solitary small nodule in the right lobe measuring 4 mm. No parathyroid adenoma  2. Osteopenia - On the DEXA scan obtained on 12/11/2014  - She has the lowest bone mineral density at the level of the 33% distal radius, which is usually seen in hyperparathyroidism   Plan: This was mostly a counseling session, in which we discussed about her previous investigation, both blood work, urine tests and imaging tests.   1. Patient with clinical and lab picture is indicative of primary hyperparathyroidism, however, there is no associated lesion on the imaging tests (technetium sestamibi scan and cervical ultrasound). Her new DEXA scan shows changes that are consistent with those caused by hyperparathyroidism. This would confirm the initial suspicion for primary hyperparathyroidism. I discussed with Dr. Harlow Asa about the case and we concluded that the possible options at this point are: - Expectant management - Obtain a 4D CT, That has a 50-50 chance to show an adenoma  - Proceed with neck surgical exploration in hopes to find a parathyroid adenoma - I think another option would be to start bisphosphonates to lower her calcium levels and improve her bone density. However, this is basically a palliative measure. Patient weighed in the above options, and she would like to go ahead with neck exploration. She tells me that she actually was thinking about this even before she came to this appointment and wanted to bring this to Dr. Gala Lewandowsky attention. She has an appointment with him at the end of this month, which I advised her to keep.  2. Osteopenia - We discussed  about her recent diagnosis of low bone mineral density in the fact that the lowest one was found at the level of the 33% distal radius, which is usually seen with hyperparathyroidism, and is not typical for  postmenopausal bone density decrease.  - If she undergoes surgical exploration with possible adenoma excision, I would like to repeat her bone density scan in 2 years to see if her BMD improves. However, if she does not undergo excision (a single parathyroid adenoma is not found), then we might need to start bisphosphonates as mentioned above.  - time spent with the patient: 40 minutes, of which >50% was spent in reviewing her previous labs and evaluations, counseling her about the results (please see the discussed topics above), and developing a plan to further investigate and treat her hypercalcemia and osteopenia; she had a number of questions which I addressed.  Return in about 6 months (around 06/21/2015).

## 2015-01-04 ENCOUNTER — Ambulatory Visit: Payer: Self-pay | Admitting: Surgery

## 2015-01-23 ENCOUNTER — Encounter (HOSPITAL_COMMUNITY)
Admission: RE | Admit: 2015-01-23 | Discharge: 2015-01-23 | Disposition: A | Discharge status: Home / Self Care | Payer: BC Managed Care – PPO | Source: Ambulatory Visit | Attending: Surgery | Admitting: Surgery

## 2015-01-23 ENCOUNTER — Encounter (HOSPITAL_COMMUNITY): Payer: Self-pay

## 2015-01-23 DIAGNOSIS — Z7982 Long term (current) use of aspirin: Secondary | ICD-10-CM | POA: Diagnosis not present

## 2015-01-23 DIAGNOSIS — F329 Major depressive disorder, single episode, unspecified: Secondary | ICD-10-CM | POA: Diagnosis not present

## 2015-01-23 DIAGNOSIS — M1990 Unspecified osteoarthritis, unspecified site: Secondary | ICD-10-CM | POA: Diagnosis not present

## 2015-01-23 DIAGNOSIS — E210 Primary hyperparathyroidism: Secondary | ICD-10-CM | POA: Diagnosis present

## 2015-01-23 DIAGNOSIS — M810 Age-related osteoporosis without current pathological fracture: Secondary | ICD-10-CM | POA: Diagnosis not present

## 2015-01-23 DIAGNOSIS — Z87891 Personal history of nicotine dependence: Secondary | ICD-10-CM | POA: Diagnosis not present

## 2015-01-23 DIAGNOSIS — Z91041 Radiographic dye allergy status: Secondary | ICD-10-CM | POA: Diagnosis not present

## 2015-01-23 DIAGNOSIS — F419 Anxiety disorder, unspecified: Secondary | ICD-10-CM | POA: Diagnosis not present

## 2015-01-23 DIAGNOSIS — I10 Essential (primary) hypertension: Secondary | ICD-10-CM | POA: Diagnosis not present

## 2015-01-23 DIAGNOSIS — E0789 Other specified disorders of thyroid: Secondary | ICD-10-CM | POA: Diagnosis not present

## 2015-01-23 DIAGNOSIS — K219 Gastro-esophageal reflux disease without esophagitis: Secondary | ICD-10-CM | POA: Diagnosis not present

## 2015-01-23 DIAGNOSIS — I739 Peripheral vascular disease, unspecified: Secondary | ICD-10-CM | POA: Diagnosis not present

## 2015-01-23 HISTORY — DX: Adverse effect of unspecified anesthetic, initial encounter: T41.45XA

## 2015-01-23 HISTORY — DX: Other complications of anesthesia, initial encounter: T88.59XA

## 2015-01-23 LAB — BASIC METABOLIC PANEL
Anion gap: 8 (ref 5–15)
BUN: 13 mg/dL (ref 6–20)
CHLORIDE: 107 mmol/L (ref 101–111)
CO2: 25 mmol/L (ref 22–32)
CREATININE: 0.92 mg/dL (ref 0.44–1.00)
Calcium: 10.5 mg/dL — ABNORMAL HIGH (ref 8.9–10.3)
GFR calc Af Amer: >60 mL/min (ref >60)
GFR calc non Af Amer: >60 mL/min (ref >60)
GLUCOSE: 103 mg/dL — AB (ref 65–99)
POTASSIUM: 4.1 mmol/L (ref 3.5–5.1)
Sodium: 140 mmol/L (ref 135–145)

## 2015-01-23 LAB — CBC
HEMATOCRIT: 39.7 % (ref 36.0–46.0)
HEMOGLOBIN: 13.5 g/dL (ref 12.0–15.0)
MCH: 30.5 pg (ref 26.0–34.0)
MCHC: 34 g/dL (ref 30.0–36.0)
MCV: 89.6 fL (ref 78.0–100.0)
Platelets: 184 10*3/uL (ref 150–400)
RBC: 4.43 MIL/uL (ref 3.87–5.11)
RDW: 12.3 % (ref 11.5–15.5)
WBC: 5.8 10*3/uL (ref 4.0–10.5)

## 2015-01-23 NOTE — Progress Notes (Signed)
Has had some issues with her heart racing.  Had worn holter monitor for 30 days. Had cardiac cath in 2007 with no repeat.  Recently 07/2014 had echo and stress.   Cardio  -  Dr. Irish Lack.  Last saw his PA about 1 yr ago.  Currently has no problems, but her heartrate has been up as high as 150 for hrs, she says.

## 2015-01-23 NOTE — Pre-Procedure Instructions (Signed)
[name]  01/23/2015   Your procedure is scheduled on: Tuesday, May 24th   Report to [facility] Admitting at 10:30 AM.  Call this number if you have problems the morning of surgery: [phone]   Remember:   Do not eat food or drink liquids after midnight Monday.   Take these medicines the morning of surgery with A SIP OF WATER: Atenolol, Dexilant, Lexapro   Do not wear jewelry, make-up or nail polish.  Do not wear lotions, powders, or perfumes. You may NOT wear deodorant the day of surgery.  Do not shave underarms & legs 48 hours prior to surgery.    Do not bring valuables to the hospital.  [facility] is not responsible for any belongings or valuables.               Contacts, dentures or bridgework may not be worn into surgery.  Leave suitcase in the car. After surgery it may be brought to your room.  For patients admitted to the hospital, discharge time is determined by your treatment team.    Name and phone number of your driver:    Special Instructions: "Preparing for Surgery" instruction sheet.   Please read over the following fact sheets that you were given: Pain Booklet, Coughing and Deep Breathing and Surgical Site Infection Prevention

## 2015-01-24 NOTE — Progress Notes (Signed)
Anesthesia Chart Review:  Pt is 59 year old female scheduled for neck exploration with parathyroidectomy on 01/31/2015 with Dr. Harlow Asa.   PMH includes: HTN, tachycardia, PVD, hyperlipidemia, DVT, GERD, hyperglycemia. Former smoker. BMI 36.  Preoperative labs reviewed.   Chest x-ray 07/14/2014 reviewed. No acute cardiopulmonary abnormality seen.   EKG: NSR. Possible Left atrial enlargement. RSR' or QR pattern in V1 suggests right ventricular conduction delay. T wave abnormality, consider anterior ischemia. Appears unchanged when compared to prior tracings dating back to 06/01/2012.   Nuclear stress test 07/20/2014: Low risk stress nuclear study with fixed, small mild apical anterior and apical inferolateral perfusion defects. Suspect soft tissue attenuation given normal wall motion. No ischemia. Nonspecific ECG response with baseline abnormalities. LV Ejection Fraction: 71%. LV Wall Motion: NL LV Function; NL Wall Motion   Echo 07/20/2014: - Left ventricle: The cavity size was normal. Wall thickness was increased in a pattern of mild LVH. Systolic function was normal. The estimated ejection fraction was in the range of 55% to 60%. No gross wall motion abnormalities, however, there is mildly abnormal basal inferior/inferoseptal strain - overall GLPSS is mildly reduced at -16%. This could represent an early ischemic finding. Doppler parameters are consistent with abnormal left ventricular relaxation (grade 1 diastolic dysfunction). The E/e&' ratio is between 8-15, suggesting indeterminate LV filling pressure. - Mitral valve: Structurally normal valve. There was trivial regurgitation. - Left atrium: The atrium was normal in size. Impressions: - LVEF 55-60%, mild LVH, diasolic dysfunction, indeterminate LVfilling pressure, mildly abnormal GLPSS at -16%, basalinferior/inferoseptal strain abnormality could suggest ischemia  Cardiac cath 11/27/2005: 1. Normal left ventricular function. 2. Normal  coronary arteries.  If no changes, I anticipate pt can proceed with surgery as scheduled.   Willeen Cass, FNP-BC [facility]/Anesthesiology Phone: [phone] 01/24/2015 3:00 PM

## 2015-01-30 ENCOUNTER — Encounter (HOSPITAL_COMMUNITY): Payer: Self-pay | Admitting: Surgery

## 2015-01-30 NOTE — Progress Notes (Signed)
Patient called with time change.spoke with patient  be here at 1100

## 2015-01-30 NOTE — H&P (Signed)
General Surgery [facility]  [name] DOB: 07/11/1956 Married / Language: English / Race: White Female  History of Present Illness  The patient is a 59 year old female who presents with a parathyroid neoplasm. Patient returns for follow-up. She has been undergoing evaluation for primary hyperparathyroidism. While her laboratory studies are consistent with primary hyperparathyroidism, her imaging studies including a nuclear medicine parathyroid scan and ultrasound examination failed to localize a parathyroid adenoma. Patient underwent a bone density scan on December 05, 2014 which does show osteoporosis. Patient presents today accompanied by her husband. She wishes to proceed with neck exploration and parathyroidectomy in the near future. Patient continues to note chronic fatigue. She has had no fractures. She has had no other complications.   Allergies  Contrast Media Ready-Box *MEDICAL DEVICES AND SUPPLIES* Iohexol *DIAGNOSTIC PRODUCTS*  Medication History Atenolol (25MG  Tablet, Oral) Active. Dexilant (60MG  Capsule DR, Oral) Active. Enalapril Maleate (5MG  Tablet, Oral) Active. Escitalopram Oxalate (20MG  Tablet, Oral) Active. Vitamin D3 (5000UNIT Capsule, Oral) Active. Aspirin EC (81MG  Tablet DR, Oral) Active. Nitrostat (0.4MG  Tab Sublingual, Sublingual as needed) Active. EpiPen 2-Pak (0.3 MG/0.3ML(1:1000) Device, Intramuscular as needed) Active. Medications Reconciled  Vitals Weight: 197 lb Height: 62in Body Surface Area: 1.98 m Body Mass Index: 36.03 kg/m Temp.: 97.89F(Temporal)  Pulse: 79 (Regular)  BP: 132/72 (Sitting, Left Arm, Standard)    Physical Exam  General - appears comfortable, no distress; not diaphorectic  HEENT - normocephalic; sclerae clear, gaze conjugate; mucous membranes moist, dentition good; voice normal  Neck - symmetric on extension; no palpable anterior or posterior cervical adenopathy; no  palpable masses in the thyroid bed  Chest - clear bilaterally without rhonchi, rales, or wheeze  Cor - regular rhythm with normal rate; no significant murmur  Ext - non-tender without significant edema or lymphedema  Neuro - grossly intact; no tremor    Assessment & Plan HYPERCALCEMIA (275.42  E83.52) OSTEOPOROSIS (733.00  M81.0)  You are being scheduled for surgery.  You should hear from our office's scheduling department within 5 working days about the location, date, and time of surgery. We try to make accommodations for patient's preferences in scheduling surgery, but sometimes the OR schedule or Dr. Gala Lewandowsky schedule prevents [place] from making those accommodations.  If you have not heard from our office [phone]) in 4 working days, call the office and ask for Dr. Gala Lewandowsky nurse.  If you have other questions about your diagnosis, plan, or surgery, call the office and ask for Dr. Gala Lewandowsky nurse.  Patient and her husband present today to discuss the results of her bone density scan and her recent laboratory studies. Recent laboratories on November 21, 2014 show a persistent elevated calcium level of 10.3 with a normal intact PTH level of 32. 24-hour urine collection for calcium was elevated at 308. Bone density scan shows osteoporosis. Patient continues to complain of chronic fatigue. She and her husband are interested in proceeding with surgery.  We discussed neck exploration and possible parathyroidectomy. We discussed the possibility of not finding a parathyroid adenoma and requiring additional diagnostic procedures. We discussed the risk and benefits of surgery including the potential for recurrent laryngeal nerve injury or possible need for partial thyroidectomy. They understand and wish to proceed. We will make arrangements for surgery at a time in the near future.  The risks and benefits of the procedure have been discussed at length with the patient. The patient understands the  proposed procedure, potential alternative treatments, and the course of recovery  to be expected. All of the patient's questions have been answered at this time. The patient wishes to proceed with surgery.  Earnstine Regal, MD, [facility] Office: [phone]

## 2015-01-31 ENCOUNTER — Ambulatory Visit (HOSPITAL_COMMUNITY): Payer: BC Managed Care – PPO | Admitting: Anesthesiology

## 2015-01-31 ENCOUNTER — Encounter (HOSPITAL_COMMUNITY): Payer: Self-pay | Admitting: Anesthesiology

## 2015-01-31 ENCOUNTER — Encounter (HOSPITAL_COMMUNITY)
Admission: RE | Disposition: A | Discharge status: Home / Self Care | Payer: Self-pay | Source: Ambulatory Visit | Attending: Surgery

## 2015-01-31 ENCOUNTER — Observation Stay (HOSPITAL_COMMUNITY)
Admission: RE | Admit: 2015-01-31 | Discharge: 2015-02-01 | Disposition: A | Discharge status: Home / Self Care | Payer: BC Managed Care – PPO | Source: Ambulatory Visit | Attending: Surgery | Admitting: Surgery

## 2015-01-31 ENCOUNTER — Ambulatory Visit (HOSPITAL_COMMUNITY): Payer: BC Managed Care – PPO | Admitting: Emergency Medicine

## 2015-01-31 DIAGNOSIS — E210 Primary hyperparathyroidism: Secondary | ICD-10-CM | POA: Diagnosis not present

## 2015-01-31 DIAGNOSIS — F329 Major depressive disorder, single episode, unspecified: Secondary | ICD-10-CM | POA: Insufficient documentation

## 2015-01-31 DIAGNOSIS — E0789 Other specified disorders of thyroid: Secondary | ICD-10-CM | POA: Insufficient documentation

## 2015-01-31 DIAGNOSIS — F419 Anxiety disorder, unspecified: Secondary | ICD-10-CM | POA: Insufficient documentation

## 2015-01-31 DIAGNOSIS — I739 Peripheral vascular disease, unspecified: Secondary | ICD-10-CM | POA: Insufficient documentation

## 2015-01-31 DIAGNOSIS — M810 Age-related osteoporosis without current pathological fracture: Secondary | ICD-10-CM | POA: Insufficient documentation

## 2015-01-31 DIAGNOSIS — I10 Essential (primary) hypertension: Secondary | ICD-10-CM | POA: Insufficient documentation

## 2015-01-31 DIAGNOSIS — M1990 Unspecified osteoarthritis, unspecified site: Secondary | ICD-10-CM | POA: Insufficient documentation

## 2015-01-31 DIAGNOSIS — Z91041 Radiographic dye allergy status: Secondary | ICD-10-CM | POA: Insufficient documentation

## 2015-01-31 DIAGNOSIS — K219 Gastro-esophageal reflux disease without esophagitis: Secondary | ICD-10-CM | POA: Insufficient documentation

## 2015-01-31 DIAGNOSIS — Z87891 Personal history of nicotine dependence: Secondary | ICD-10-CM | POA: Insufficient documentation

## 2015-01-31 DIAGNOSIS — Z7982 Long term (current) use of aspirin: Secondary | ICD-10-CM | POA: Insufficient documentation

## 2015-01-31 HISTORY — DX: Congenital malformation of kidney, unspecified: Q63.9

## 2015-01-31 HISTORY — DX: Headache: R51

## 2015-01-31 HISTORY — DX: Pneumonia, unspecified organism: J18.9

## 2015-01-31 HISTORY — PX: PARATHYROIDECTOMY: SHX19

## 2015-01-31 HISTORY — DX: Calculus of kidney: N20.0

## 2015-01-31 HISTORY — DX: Headache, unspecified: R51.9

## 2015-01-31 HISTORY — PX: NECK EXPLORATION: SHX2077

## 2015-01-31 HISTORY — DX: Malignant neoplasm of unspecified ovary: C56.9

## 2015-01-31 HISTORY — DX: Anemia, unspecified: D64.9

## 2015-01-31 SURGERY — PARATHYROIDECTOMY
Anesthesia: General | Site: Neck

## 2015-01-31 MED ORDER — HYDROMORPHONE HCL 1 MG/ML IJ SOLN
0.2500 mg | INTRAMUSCULAR | Status: DC | PRN
  Administered 2015-01-31 (×4): 0.5 mg via INTRAVENOUS

## 2015-01-31 MED ORDER — BUPIVACAINE HCL (PF) 0.25 % IJ SOLN
INTRAMUSCULAR | Status: AC
  Filled 2015-01-31: qty 30

## 2015-01-31 MED ORDER — GLYCOPYRROLATE 0.2 MG/ML IJ SOLN
INTRAMUSCULAR | Status: AC
  Filled 2015-01-31: qty 3

## 2015-01-31 MED ORDER — HYDROCODONE-ACETAMINOPHEN 5-325 MG PO TABS
1.0000 | ORAL_TABLET | ORAL | Status: DC | PRN
  Administered 2015-01-31: 2 via ORAL
  Filled 2015-01-31: qty 2

## 2015-01-31 MED ORDER — PHENYLEPHRINE 40 MCG/ML (10ML) SYRINGE FOR IV PUSH (FOR BLOOD PRESSURE SUPPORT)
PREFILLED_SYRINGE | INTRAVENOUS | Status: AC
  Filled 2015-01-31: qty 10

## 2015-01-31 MED ORDER — ATENOLOL 25 MG PO TABS
25.0000 mg | ORAL_TABLET | Freq: Every day | ORAL | Status: DC
Start: 2015-02-01 — End: 2015-02-01

## 2015-01-31 MED ORDER — KCL IN DEXTROSE-NACL 20-5-0.45 MEQ/L-%-% IV SOLN
INTRAVENOUS | Status: AC
  Filled 2015-01-31: qty 1000

## 2015-01-31 MED ORDER — LIDOCAINE HCL (CARDIAC) 20 MG/ML IV SOLN
INTRAVENOUS | Status: AC
  Filled 2015-01-31: qty 5

## 2015-01-31 MED ORDER — LACTATED RINGERS IV SOLN
INTRAVENOUS | Status: DC | PRN
  Administered 2015-01-31 (×2): via INTRAVENOUS

## 2015-01-31 MED ORDER — ONDANSETRON HCL 4 MG PO TABS: 4.0000 mg | ORAL_TABLET | Freq: Four times a day (QID) | ORAL | Status: DC | PRN

## 2015-01-31 MED ORDER — KCL IN DEXTROSE-NACL 20-5-0.45 MEQ/L-%-% IV SOLN
INTRAVENOUS | Status: DC
  Administered 2015-01-31 – 2015-02-01 (×2): via INTRAVENOUS
  Filled 2015-01-31 (×2): qty 1000

## 2015-01-31 MED ORDER — 0.9 % SODIUM CHLORIDE (POUR BTL) OPTIME
TOPICAL | Status: DC | PRN
  Administered 2015-01-31: 1000 mL

## 2015-01-31 MED ORDER — NEOSTIGMINE METHYLSULFATE 10 MG/10ML IV SOLN
INTRAVENOUS | Status: AC
  Filled 2015-01-31: qty 1

## 2015-01-31 MED ORDER — STERILE WATER FOR INJECTION IJ SOLN
INTRAMUSCULAR | Status: AC
  Filled 2015-01-31: qty 10

## 2015-01-31 MED ORDER — MIDAZOLAM HCL 5 MG/5ML IJ SOLN
INTRAMUSCULAR | Status: DC | PRN
  Administered 2015-01-31: 2 mg via INTRAVENOUS

## 2015-01-31 MED ORDER — ONDANSETRON HCL 4 MG/2ML IJ SOLN
4.0000 mg | Freq: Four times a day (QID) | INTRAMUSCULAR | Status: DC | PRN
  Administered 2015-01-31: 4 mg via INTRAVENOUS
  Filled 2015-01-31: qty 2

## 2015-01-31 MED ORDER — ACETAMINOPHEN 325 MG PO TABS: 650.0000 mg | ORAL_TABLET | ORAL | Status: DC | PRN

## 2015-01-31 MED ORDER — PROPOFOL 10 MG/ML IV BOLUS
INTRAVENOUS | Status: DC | PRN
  Administered 2015-01-31: 150 mg via INTRAVENOUS

## 2015-01-31 MED ORDER — SUCCINYLCHOLINE CHLORIDE 20 MG/ML IJ SOLN
INTRAMUSCULAR | Status: AC
  Filled 2015-01-31: qty 1

## 2015-01-31 MED ORDER — NITROGLYCERIN 0.4 MG SL SUBL: 0.4000 mg | SUBLINGUAL_TABLET | SUBLINGUAL | Status: DC | PRN

## 2015-01-31 MED ORDER — ONDANSETRON HCL 4 MG/2ML IJ SOLN
INTRAMUSCULAR | Status: AC
  Filled 2015-01-31: qty 2

## 2015-01-31 MED ORDER — ROCURONIUM BROMIDE 50 MG/5ML IV SOLN
INTRAVENOUS | Status: AC
  Filled 2015-01-31: qty 1

## 2015-01-31 MED ORDER — CEFAZOLIN SODIUM-DEXTROSE 2-3 GM-% IV SOLR
2.0000 g | INTRAVENOUS | Status: AC
  Administered 2015-01-31: 2 g via INTRAVENOUS
  Filled 2015-01-31: qty 50

## 2015-01-31 MED ORDER — FENTANYL CITRATE (PF) 250 MCG/5ML IJ SOLN
INTRAMUSCULAR | Status: AC
  Filled 2015-01-31: qty 5

## 2015-01-31 MED ORDER — HYDROMORPHONE HCL 1 MG/ML IJ SOLN
INTRAMUSCULAR | Status: AC
  Filled 2015-01-31: qty 1

## 2015-01-31 MED ORDER — HYDROMORPHONE HCL 1 MG/ML IJ SOLN
1.0000 mg | INTRAMUSCULAR | Status: DC | PRN
  Administered 2015-01-31 – 2015-02-01 (×3): 1 mg via INTRAVENOUS
  Filled 2015-01-31 (×3): qty 1

## 2015-01-31 MED ORDER — ENALAPRIL MALEATE 5 MG PO TABS
5.0000 mg | ORAL_TABLET | Freq: Every day | ORAL | Status: DC
  Administered 2015-01-31: 5 mg via ORAL
  Filled 2015-01-31 (×2): qty 1

## 2015-01-31 MED ORDER — ESCITALOPRAM OXALATE 20 MG PO TABS: 20.0000 mg | ORAL_TABLET | Freq: Every day | ORAL | Status: DC

## 2015-01-31 MED ORDER — EPHEDRINE SULFATE 50 MG/ML IJ SOLN
INTRAMUSCULAR | Status: AC
  Filled 2015-01-31: qty 1

## 2015-01-31 MED ORDER — SUCCINYLCHOLINE CHLORIDE 20 MG/ML IJ SOLN
INTRAMUSCULAR | Status: DC | PRN
  Administered 2015-01-31: 100 mg via INTRAVENOUS

## 2015-01-31 MED ORDER — LACTATED RINGERS IV SOLN
Freq: Once | INTRAVENOUS | Status: AC
  Administered 2015-01-31: 12:00:00 via INTRAVENOUS

## 2015-01-31 MED ORDER — FENTANYL CITRATE (PF) 100 MCG/2ML IJ SOLN
INTRAMUSCULAR | Status: DC | PRN
  Administered 2015-01-31: 50 ug via INTRAVENOUS
  Administered 2015-01-31 (×2): 100 ug via INTRAVENOUS

## 2015-01-31 MED ORDER — MIDAZOLAM HCL 2 MG/2ML IJ SOLN
INTRAMUSCULAR | Status: AC
  Filled 2015-01-31: qty 2

## 2015-01-31 SURGICAL SUPPLY — 56 items
APL SKNCLS STERI-STRIP NONHPOA (GAUZE/BANDAGES/DRESSINGS) ×1
ATTRACTOMAT 16X20 MAGNETIC DRP (DRAPES) ×3 IMPLANT
BENZOIN TINCTURE PRP APPL 2/3 (GAUZE/BANDAGES/DRESSINGS) ×3 IMPLANT
BLADE SURG 10 STRL SS (BLADE) ×3 IMPLANT
BLADE SURG 15 STRL LF DISP TIS (BLADE) ×1 IMPLANT
BLADE SURG 15 STRL SS (BLADE) ×3
CANISTER SUCTION 2500CC (MISCELLANEOUS) ×3 IMPLANT
CHLORAPREP W/TINT 26ML (MISCELLANEOUS) ×3 IMPLANT
CLIP TI MEDIUM 6 (CLIP) ×3 IMPLANT
CLIP TI WIDE RED SMALL 6 (CLIP) ×3 IMPLANT
CLOSURE STERI-STRIP 1/2X4 (GAUZE/BANDAGES/DRESSINGS) ×1
CLOSURE WOUND 1/2 X4 (GAUZE/BANDAGES/DRESSINGS) ×1
CLSR STERI-STRIP ANTIMIC 1/2X4 (GAUZE/BANDAGES/DRESSINGS) ×2 IMPLANT
CONT SPEC 4OZ CLIKSEAL STRL BL (MISCELLANEOUS) ×3 IMPLANT
COVER SURGICAL LIGHT HANDLE (MISCELLANEOUS) ×3 IMPLANT
DRAPE PED LAPAROTOMY (DRAPES) ×3 IMPLANT
DRAPE UTILITY XL STRL (DRAPES) ×6 IMPLANT
ELECT CAUTERY BLADE 6.4 (BLADE) ×3 IMPLANT
ELECT REM PT RETURN 9FT ADLT (ELECTROSURGICAL) ×3
ELECTRODE REM PT RTRN 9FT ADLT (ELECTROSURGICAL) ×1 IMPLANT
GAUZE SPONGE 2X2 8PLY STRL LF (GAUZE/BANDAGES/DRESSINGS) ×1 IMPLANT
GAUZE SPONGE 4X4 16PLY XRAY LF (GAUZE/BANDAGES/DRESSINGS) ×3 IMPLANT
GLOVE BIOGEL PI IND STRL 6.5 (GLOVE) ×2 IMPLANT
GLOVE BIOGEL PI IND STRL 7.0 (GLOVE) ×3 IMPLANT
GLOVE BIOGEL PI INDICATOR 6.5 (GLOVE) ×4
GLOVE BIOGEL PI INDICATOR 7.0 (GLOVE) ×6
GLOVE SURG ORTHO 8.0 STRL STRW (GLOVE) ×3 IMPLANT
GLOVE SURG SS PI 7.0 STRL IVOR (GLOVE) ×6 IMPLANT
GOWN STRL REUS W/ TWL LRG LVL3 (GOWN DISPOSABLE) ×2 IMPLANT
GOWN STRL REUS W/ TWL XL LVL3 (GOWN DISPOSABLE) ×1 IMPLANT
GOWN STRL REUS W/TWL LRG LVL3 (GOWN DISPOSABLE) ×6
GOWN STRL REUS W/TWL XL LVL3 (GOWN DISPOSABLE) ×3
HEMOSTAT SURGICEL 2X4 FIBR (HEMOSTASIS) ×3 IMPLANT
KIT BASIN OR (CUSTOM PROCEDURE TRAY) ×3 IMPLANT
KIT ROOM TURNOVER OR (KITS) ×3 IMPLANT
NEEDLE HYPO 25GX1X1/2 BEV (NEEDLE) IMPLANT
NS IRRIG 1000ML POUR BTL (IV SOLUTION) ×3 IMPLANT
PACK SURGICAL SETUP 50X90 (CUSTOM PROCEDURE TRAY) ×3 IMPLANT
PAD ARMBOARD 7.5X6 YLW CONV (MISCELLANEOUS) ×6 IMPLANT
PENCIL BUTTON HOLSTER BLD 10FT (ELECTRODE) ×3 IMPLANT
SPONGE GAUZE 2X2 STER 10/PKG (GAUZE/BANDAGES/DRESSINGS) ×2
SPONGE INTESTINAL PEANUT (DISPOSABLE) IMPLANT
STRIP CLOSURE SKIN 1/2X4 (GAUZE/BANDAGES/DRESSINGS) ×2 IMPLANT
SUT MNCRL AB 4-0 PS2 18 (SUTURE) ×3 IMPLANT
SUT SILK 2 0 (SUTURE)
SUT SILK 2-0 18XBRD TIE 12 (SUTURE) IMPLANT
SUT SILK 3 0 (SUTURE)
SUT SILK 3-0 18XBRD TIE 12 (SUTURE) IMPLANT
SUT VIC AB 3-0 SH 18 (SUTURE) ×6 IMPLANT
SYR BULB 3OZ (MISCELLANEOUS) ×3 IMPLANT
SYR CONTROL 10ML LL (SYRINGE) IMPLANT
TAPE CLOTH SURG 4X10 WHT LF (GAUZE/BANDAGES/DRESSINGS) ×3 IMPLANT
TOWEL OR 17X24 6PK STRL BLUE (TOWEL DISPOSABLE) ×3 IMPLANT
TOWEL OR 17X26 10 PK STRL BLUE (TOWEL DISPOSABLE) ×3 IMPLANT
TUBE CONNECTING 12'X1/4 (SUCTIONS) ×1
TUBE CONNECTING 12X1/4 (SUCTIONS) ×2 IMPLANT

## 2015-01-31 NOTE — Progress Notes (Signed)
Quick Note:  These results are acceptable for scheduled surgery.  Odile Veloso M. Lakin Rhine, MD, FACS [facility] Office: [phone]   ______ 

## 2015-01-31 NOTE — Anesthesia Procedure Notes (Signed)
Procedure Name: Intubation Date/Time: 01/31/2015 1:22 PM Performed by: ADAMI, RICHARD Pre-anesthesia Checklist: Emergency Drugs available, Patient identified, Timeout performed, Suction available and Patient being monitored Patient Re-evaluated:Patient Re-evaluated prior to inductionOxygen Delivery Method: Circle system utilized Preoxygenation: Pre-oxygenation with 100% oxygen Intubation Type: IV induction Ventilation: Mask ventilation without difficulty Laryngoscope Size: Mac and 3 Grade View: Grade I Tube type: Oral Tube size: 2.5 mm Number of attempts: 1 Airway Equipment and Method: Stylet and LTA kit utilized Placement Confirmation: ETT inserted through vocal cords under direct vision,  breath sounds checked- equal and bilateral and positive ETCO2 Secured at: 21 cm Tube secured with: Tape Dental Injury: Teeth and Oropharynx as per pre-operative assessment      

## 2015-01-31 NOTE — Anesthesia Preprocedure Evaluation (Addendum)
Anesthesia Evaluation  Patient identified by MRN, date of birth, ID band Patient awake    Reviewed: Allergy & Precautions, H&P , NPO status , Patient's Chart, lab work & pertinent test results, reviewed documented beta blocker date and time   Airway Mallampati: II  TM Distance: >3 FB Neck ROM: Full    Dental no notable dental hx. (+) Teeth Intact, Dental Advisory Given   Pulmonary neg pulmonary ROS, former smoker,  breath sounds clear to auscultation  Pulmonary exam normal       Cardiovascular hypertension, Pt. on medications and Pt. on home beta blockers + Peripheral Vascular Disease Rhythm:Regular Rate:Normal     Neuro/Psych Anxiety Depression negative neurological ROS     GI/Hepatic Neg liver ROS, GERD-  Medicated and Controlled,  Endo/Other  Morbid obesityHyperparathyroidism  Renal/GU negative Renal ROS  negative genitourinary   Musculoskeletal  (+) Arthritis -, Osteoarthritis,  Fibromyalgia -  Abdominal   Peds  Hematology negative hematology ROS (+)   Anesthesia Other Findings   Reproductive/Obstetrics negative OB ROS                            Anesthesia Physical Anesthesia Plan  ASA: II  Anesthesia Plan: General   Post-op Pain Management:    Induction: Intravenous  Airway Management Planned: Oral ETT  Additional Equipment:   Intra-op Plan:   Post-operative Plan: Extubation in OR  Informed Consent: I have reviewed the patients History and Physical, chart, labs and discussed the procedure including the risks, benefits and alternatives for the proposed anesthesia with the patient or authorized representative who has indicated his/her understanding and acceptance.   Dental advisory given  Plan Discussed with: CRNA  Anesthesia Plan Comments:        Anesthesia Quick Evaluation

## 2015-01-31 NOTE — Interval H&P Note (Signed)
History and Physical Interval Note:  01/31/2015 1:06 PM  [name]  has presented today for surgery, with the diagnosis of PRIMARY HYPERPARATHYROIDISM.  The various methods of treatment have been discussed with the patient and family. After consideration of risks, benefits and other options for treatment, the patient has consented to    Procedure(s): NECK EXPLORATION WITH PARATHYROIDECTOMY (N/A) as a surgical intervention .    The patient's history has been reviewed, patient examined, no change in status, stable for surgery.  I have reviewed the patient's chart and labs.  Questions were answered to the patient's satisfaction.    Earnstine Regal, MD, [facility] Office: [place]

## 2015-01-31 NOTE — Transfer of Care (Signed)
Immediate Anesthesia Transfer of Care Note  Patient: [name]  Procedure(s) Performed: Procedure(s): NECK EXPLORATION WITH PARATHYROIDECTOMY (N/A)  Patient Location: PACU  Anesthesia Type:General  Level of Consciousness: awake and alert   Airway & Oxygen Therapy: Patient Spontanous Breathing and Patient connected to nasal cannula oxygen  Post-op Assessment: Report given to RN and Post -op Vital signs reviewed and stable  Post vital signs: Reviewed and stable  Last Vitals:  Filed Vitals:   01/31/15 1100  BP: 134/81  Pulse: 60  Temp: 36.3 C  Resp: 18    Complications: No apparent anesthesia complications

## 2015-01-31 NOTE — Progress Notes (Signed)
Quick Note:  EKG is acceptable for scheduled surgery.  Kelsey Myhand M. Tayt Moyers, MD, FACS [facility] Office: [phone]   ______ 

## 2015-01-31 NOTE — Brief Op Note (Signed)
01/31/2015  3:13 PM  PATIENT:  [name]  59 y.o. female  PRE-OPERATIVE DIAGNOSIS:  PRIMARY HYPERPARATHYROIDISM  POST-OPERATIVE DIAGNOSIS:  PRIMARY HYPERPARATHYROIDISM  PROCEDURE:  Procedure(s): NECK EXPLORATION WITH PARATHYROIDECTOMY (N/A)  SURGEON:  Surgeon(s) and Role:    * Armandina Gemma, MD - Primary  ANESTHESIA:   general  EBL:  Total I/O In: 1700 [I.V.:1700] Out: -   BLOOD ADMINISTERED:none  DRAINS: none   LOCAL MEDICATIONS USED:  NONE  SPECIMEN:  Excision  DISPOSITION OF SPECIMEN:  PATHOLOGY  COUNTS:  YES  TOURNIQUET:  * No tourniquets in log *  DICTATION: .Other Dictation: Dictation Number [id number]  PLAN OF CARE: Admit for overnight observation  PATIENT DISPOSITION:  PACU - hemodynamically stable.   Delay start of Pharmacological VTE agent (>24hrs) due to surgical blood loss or risk of bleeding: yes  Earnstine Regal, MD, [facility] Office: [phone]

## 2015-02-01 ENCOUNTER — Encounter (HOSPITAL_COMMUNITY): Payer: Self-pay | Admitting: Surgery

## 2015-02-01 DIAGNOSIS — E210 Primary hyperparathyroidism: Secondary | ICD-10-CM | POA: Diagnosis not present

## 2015-02-01 LAB — BASIC METABOLIC PANEL
ANION GAP: 5 (ref 5–15)
BUN: 9 mg/dL (ref 6–20)
CHLORIDE: 104 mmol/L (ref 101–111)
CO2: 28 mmol/L (ref 22–32)
Calcium: 9.6 mg/dL (ref 8.9–10.3)
Creatinine, Ser: 1.01 mg/dL — ABNORMAL HIGH (ref 0.44–1.00)
GFR calc Af Amer: >60 mL/min (ref >60)
Glucose, Bld: 104 mg/dL — ABNORMAL HIGH (ref 65–99)
POTASSIUM: 4.1 mmol/L (ref 3.5–5.1)
Sodium: 137 mmol/L (ref 135–145)

## 2015-02-01 MED ORDER — HYDROCODONE-ACETAMINOPHEN 5-325 MG PO TABS: 1.0000 | ORAL_TABLET | ORAL | Status: DC | PRN

## 2015-02-01 NOTE — Discharge Summary (Signed)
Physician Discharge Summary [facility]  Patient ID: [name] MRN: [id number] DOB/AGE: [birth year] 57 y.o.  Admit date: 01/31/2015 Discharge date: 02/01/2015  Admission Diagnoses:  Primary hyperparathyroidism  Discharge Diagnoses:  Principal Problem:   Hyperparathyroidism, primary Active Problems:   Primary hyperparathyroidism   Discharged Condition: good  Hospital Course: Patient was admitted for observation following parathyroid surgery.  Post op course was uncomplicated.  Pain was well controlled.  Tolerated diet.  Post op calcium level on morning following surgery was 9.6 mg/dl.  Patient was prepared for discharge home on POD#1.  Consults: None  Treatments: surgery: neck exploration and parathyroidectomy  Discharge Exam: Blood pressure 114/75, pulse 99, temperature 99.1 F (37.3 C), temperature source Oral, resp. rate 16, height 5\' 2"  (1.575 m), weight 89.812 kg (198 lb), SpO2 96 %. HEENT - clear Neck - wound dry and intact; mild STS; voice normal Chest - clear bilaterally Cor - RRR  Disposition: Home  Discharge Instructions    Apply dressing    Complete by:  As directed   Apply light gauze dressing to wound before discharge home today.     Diet - low sodium heart healthy    Complete by:  As directed      Discharge instructions    Complete by:  As directed   [facility]  THYROID & PARATHYROID SURGERY:  POST-OP INSTRUCTIONS  Always review your discharge instruction sheet from the facility where your surgery was performed.  A prescription for pain medication may be given to you upon discharge.  Take your pain medication as prescribed.  If narcotic pain medicine is not needed, then you may take acetaminophen (Tylenol) or ibuprofen (Advil) as needed.  Take your usually prescribed medications unless otherwise directed.  If you need a refill on your pain medication, please contact your pharmacy. They will contact our office  to request authorization.  Prescriptions will not be processed by our office after 5 pm or on weekends.  Start with a light diet upon arrival home, such as soup and crackers or toast.  Be sure to drink plenty of fluids daily.  Resume your normal diet the day after surgery.  Most patients will experience some swelling and bruising on the chest and neck area.  Ice packs will help.  Swelling and bruising can take several days to resolve.   It is common to experience some constipation after surgery.  Increasing fluid intake and taking a stool softener will usually help or prevent this problem.  A mild laxative (Milk of Magnesia or Miralax) should be taken according to package directions if there has been no bowel movement after 48 hours.  You have steri-strips and a gauze dressing over your incision.  You may remove the gauze bandage on the second day after surgery, and you may shower at that time.  Leave your steri-strips (small skin tapes) in place directly over the incision.  These strips should remain on the skin for 5-7 days and then be removed.  You may get them wet in the shower and pat them dry.  You may resume regular (light) daily activities beginning the next day - such as daily self-care, walking, climbing stairs - gradually increasing activities as tolerated.  You may have sexual intercourse when it is comfortable.  Refrain from any heavy lifting or straining until approved by your doctor.  You may drive when you no longer are taking prescription pain medication, you can comfortably wear a seatbelt, and you  can safely maneuver your car and apply brakes.  You should see your doctor in the office for a follow-up appointment approximately two to three weeks after your surgery.  Make sure that you call for this appointment within a day or two after you arrive home to insure a convenient appointment time.  WHEN TO CALL YOUR DOCTOR: -- Fever greater than 101.5 -- Inability to urinate -- Nausea  and/or vomiting - persistent -- Extreme swelling or bruising -- Continued bleeding from incision -- Increased pain, redness, or drainage from the incision -- Difficulty swallowing or breathing -- Muscle cramping or spasms -- Numbness or tingling in hands or around lips  The clinic staff is available to answer your questions during regular business hours.  Please don't hesitate to call and ask to speak to one of the nurses if you have concerns.  Earnstine Regal, MD, [facility] Office: [phone]  Website: [email or web address]     Increase activity slowly    Complete by:  As directed      Remove dressing in 24 hours    Complete by:  As directed             Medication List    TAKE these medications        aspirin EC 81 MG tablet  Take 81 mg by mouth daily.     atenolol 25 MG tablet  Commonly known as:  TENORMIN  1 tablet daily and an extra 1/2 tablet as needed for palpitations     [employer] VIT D 5000 HIGH-POTENCY 5000 UNITS capsule  Generic drug:  Cholecalciferol  Take 5,000 Units by mouth daily.     DEXILANT 60 MG capsule  Generic drug:  dexlansoprazole  Take 60 mg by mouth daily.     enalapril 5 MG tablet  Commonly known as:  VASOTEC  1 tablet daily and an extra tablet if systolic BP is higher than 150     EPIPEN 2-PAK 0.3 mg/0.3 mL Soaj injection  Generic drug:  EPINEPHrine  Inject into the muscle once.     escitalopram 20 MG tablet  Commonly known as:  LEXAPRO  Take 20 mg by mouth daily.     HYDROcodone-acetaminophen 5-325 MG per tablet  Commonly known as:  NORCO/VICODIN  Take 1-2 tablets by mouth every 4 (four) hours as needed for moderate pain.     ibuprofen 200 MG tablet  Commonly known as:  ADVIL,MOTRIN  Take 400-800 mg by mouth every 8 (eight) hours as needed for mild pain or moderate pain.     nitroGLYCERIN 0.4 MG SL tablet  Commonly known as:  NITROSTAT  Place 0.4 mg under the tongue  every 5 (five) minutes as needed for chest pain.           Follow-up Information    Follow up with Earnstine Regal, MD. Schedule an appointment as soon as possible for a visit in 3 weeks.   Specialty:  General Surgery   Why:  For wound re-check   Contact information:   [place] [address] [phone]       Earnstine Regal, MD, [facility] Office: [phone]   Signed: Earnstine Regal 02/01/2015, 7:56 AM

## 2015-02-01 NOTE — Op Note (Signed)
[name], CERRITOS NO.:  [id number]  MEDICAL RECORD NO.:  [id number]  LOCATION:  6N04C                        FACILITY:  [facility]  PHYSICIAN:  Earnstine Regal, MD      DATE OF BIRTH:  1956-01-25  DATE OF PROCEDURE:  01/31/2015                              OPERATIVE REPORT   PREOPERATIVE DIAGNOSIS:  Primary hyperparathyroidism.  POSTOPERATIVE DIAGNOSIS:  Primary hyperparathyroidism.  PROCEDURE:  Neck exploration and parathyroidectomy.  SURGEON:  Earnstine Regal, MD, FACS  ANESTHESIA:  General.  ESTIMATED BLOOD LOSS:  Minimal.  PREPARATION:  ChloraPrep.  COMPLICATIONS:  None.  INDICATIONS:  The patient is a 59 year old female undergoing evaluation for primary hyperparathyroidism.  The patient has mildly elevated serum calcium levels, and inappropriately upper normal range intact PTH level, negative nuclear medicine parathyroid scan, and negative neck ultrasound.  A 24-hour urine calcium was elevated.  The patient has osteoporosis.  She now comes to Surgery for neck exploration.  BODY OF REPORT:  Procedure was done in OR #9 at the [facility].  The patient was brought to the operating room, placed in supine position on the operating room table.  Following administration of general anesthesia, the patient was positioned and then prepped and draped in the usual aseptic fashion.  After ascertaining that an adequate level of anesthesia had been achieved, a Kocher incision was made with a #15 blade.  Dissection was carried through subcutaneous tissue and platysma.  Hemostasis was achieved with electrocautery.  Skin flaps were elevated cephalad and caudad from the thyroid notch to the sternal notch.  A Mahorner self-retaining retractor was placed for exposure.  Strap muscles were incised in the midline. Dissection was begun on the left side.  Strap muscles were reflected laterally.  Venous tributaries were divided between small Ligaclips. Left  thyroid lobe was completely mobilized.  Exploration was then begun. The superior pole region is explored.  There was no evidence of an enlarged parathyroid gland.  There appears to be a small normal appearing parathyroid gland just posterior to the superior pole and above the level of the inferior thyroid artery.  Further exploration shows no other abnormalities.  There were no nodules in the left lobe. Exploration is continued back to the precervical fascia.  The esophagus was mobilized.  Retroesophageal space was inspected.  Carotid artery was inspected along its length and along the carotid sheath without evidence of mass lesion.  The thyrothymic tract on the left was dissected out into the anterior mediastinum and resected.  It was fatty tissue and contained no glandular tissue, it was discarded.  At this point, the decision was made to explore the right side of the neck.  Strap muscles were reflected laterally on the right exposing the right thyroid lobe.  Right thyroid lobe was gently mobilized and venous tributaries divided between Ligaclips.  Superior pole was explored. There was a normal appearing parathyroid gland posterior to the superior pole and above the level of the inferior thyroid artery.  This was a little larger than normal, but is grossly a normal appearing gland. Further exploration was performed extending the dissection back to the precervical fascia and behind the  esophagus.  Dissection was carried inferiorly along the trachea in the tracheoesophageal groove. Thyrothymic tract on the right was resected from the anterior mediastinum and inspected.  It appears to contain a thymic remnant and may be a lymph node.  I see no evidence of parathyroid tissue.  Right thyrothymic tract was submitted to Pathology for permanent review.  At this point, a decision was made to resect the mildly enlarged right superior parathyroid gland.  This was performed using Ligaclips on  the vascular structures.  The entire gland was submitted to Pathology where Dr. Lyndon Code reviewed it on frozen section.  This does not appear to be some hypercellular.  There was a moderate amount of fatty infiltrate within the gland which would be normal.  It is slightly larger than normal.  Exploration was again continued including the right carotid sheath. Dissection was carried into the anterior mediastinum.  The retroesophageal space was inspected.  The area around the entire thyroid gland was inspected closely.  There was no evidence of enlarged parathyroid gland in this region.  At this point, a decision was made to discontinue exploration.  Neck was irrigated with warm saline and good hemostasis was achieved.  Fibrillar was placed throughout the operative field.  Strap muscles were reapproximated in the midline with interrupted 3-0 Vicryl sutures. Platysma was closed with interrupted 3-0 Vicryl sutures.  Skin was closed with a running 4-0 Monocryl subcuticular suture.  Wound was washed and dried and benzoin and Steri-Strips were applied.  Sterile dressings were applied.  The patient was awakened from anesthesia and brought to the recovery room.  The patient tolerated the procedure well.   Earnstine Regal, MD, [facility] Office: [phone]   TMG/MEDQ  D:  01/31/2015  T:  02/01/2015  Job:  [id number]

## 2015-02-01 NOTE — Anesthesia Postprocedure Evaluation (Signed)
  Anesthesia Post-op Note  Patient: [name]  Procedure(s) Performed: Procedure(s): NECK EXPLORATION WITH PARATHYROIDECTOMY (N/A)  Patient Location: PACU  Anesthesia Type:General  Level of Consciousness: awake and alert   Airway and Oxygen Therapy: Patient Spontanous Breathing  Post-op Pain: mild  Post-op Assessment: Post-op Vital signs reviewed, Patient's Cardiovascular Status Stable and Respiratory Function Stable  Post-op Vital Signs: Reviewed  Filed Vitals:   02/01/15 0549  BP: 114/75  Pulse: 99  Temp: 37.3 C  Resp: 16    Complications: No apparent anesthesia complications

## 2015-02-01 NOTE — Progress Notes (Signed)
Discharge instructions gone over with patient and spouse. Prescription given. Home medications reviewed. Follow up appointment to be made. Diet, activity, incisional care, and reasons to call the doctor gone over. Use of nitroglycerin reviewed. Patient and spouse verbalized understanding of instructions.

## 2015-02-04 ENCOUNTER — Emergency Department (HOSPITAL_COMMUNITY): Payer: BC Managed Care – PPO

## 2015-02-04 ENCOUNTER — Encounter (HOSPITAL_COMMUNITY): Payer: Self-pay | Admitting: Emergency Medicine

## 2015-02-04 ENCOUNTER — Emergency Department (HOSPITAL_COMMUNITY)
Admission: EM | Admit: 2015-02-04 | Discharge: 2015-02-04 | Disposition: A | Discharge status: Home / Self Care | Payer: BC Managed Care – PPO | Attending: Emergency Medicine | Admitting: Emergency Medicine

## 2015-02-04 DIAGNOSIS — I499 Cardiac arrhythmia, unspecified: Secondary | ICD-10-CM | POA: Insufficient documentation

## 2015-02-04 DIAGNOSIS — Z87442 Personal history of urinary calculi: Secondary | ICD-10-CM | POA: Diagnosis not present

## 2015-02-04 DIAGNOSIS — G2581 Restless legs syndrome: Secondary | ICD-10-CM | POA: Insufficient documentation

## 2015-02-04 DIAGNOSIS — R079 Chest pain, unspecified: Secondary | ICD-10-CM | POA: Diagnosis not present

## 2015-02-04 DIAGNOSIS — K219 Gastro-esophageal reflux disease without esophagitis: Secondary | ICD-10-CM | POA: Insufficient documentation

## 2015-02-04 DIAGNOSIS — M797 Fibromyalgia: Secondary | ICD-10-CM | POA: Insufficient documentation

## 2015-02-04 DIAGNOSIS — Z86718 Personal history of other venous thrombosis and embolism: Secondary | ICD-10-CM | POA: Insufficient documentation

## 2015-02-04 DIAGNOSIS — R21 Rash and other nonspecific skin eruption: Secondary | ICD-10-CM | POA: Diagnosis present

## 2015-02-04 DIAGNOSIS — M542 Cervicalgia: Secondary | ICD-10-CM | POA: Diagnosis not present

## 2015-02-04 DIAGNOSIS — Z8543 Personal history of malignant neoplasm of ovary: Secondary | ICD-10-CM | POA: Diagnosis not present

## 2015-02-04 DIAGNOSIS — Z8701 Personal history of pneumonia (recurrent): Secondary | ICD-10-CM | POA: Diagnosis not present

## 2015-02-04 DIAGNOSIS — R509 Fever, unspecified: Secondary | ICD-10-CM | POA: Diagnosis not present

## 2015-02-04 DIAGNOSIS — Z87891 Personal history of nicotine dependence: Secondary | ICD-10-CM | POA: Insufficient documentation

## 2015-02-04 DIAGNOSIS — I10 Essential (primary) hypertension: Secondary | ICD-10-CM | POA: Diagnosis not present

## 2015-02-04 DIAGNOSIS — I739 Peripheral vascular disease, unspecified: Secondary | ICD-10-CM | POA: Insufficient documentation

## 2015-02-04 DIAGNOSIS — Z8639 Personal history of other endocrine, nutritional and metabolic disease: Secondary | ICD-10-CM | POA: Insufficient documentation

## 2015-02-04 DIAGNOSIS — F419 Anxiety disorder, unspecified: Secondary | ICD-10-CM | POA: Insufficient documentation

## 2015-02-04 DIAGNOSIS — M1990 Unspecified osteoarthritis, unspecified site: Secondary | ICD-10-CM | POA: Diagnosis not present

## 2015-02-04 DIAGNOSIS — F329 Major depressive disorder, single episode, unspecified: Secondary | ICD-10-CM | POA: Insufficient documentation

## 2015-02-04 DIAGNOSIS — Z79899 Other long term (current) drug therapy: Secondary | ICD-10-CM | POA: Insufficient documentation

## 2015-02-04 DIAGNOSIS — R110 Nausea: Secondary | ICD-10-CM | POA: Diagnosis not present

## 2015-02-04 DIAGNOSIS — M79602 Pain in left arm: Secondary | ICD-10-CM | POA: Diagnosis not present

## 2015-02-04 DIAGNOSIS — M25422 Effusion, left elbow: Secondary | ICD-10-CM | POA: Diagnosis not present

## 2015-02-04 DIAGNOSIS — Z7982 Long term (current) use of aspirin: Secondary | ICD-10-CM | POA: Insufficient documentation

## 2015-02-04 DIAGNOSIS — Z9889 Other specified postprocedural states: Secondary | ICD-10-CM | POA: Diagnosis not present

## 2015-02-04 DIAGNOSIS — Q639 Congenital malformation of kidney, unspecified: Secondary | ICD-10-CM | POA: Insufficient documentation

## 2015-02-04 LAB — BASIC METABOLIC PANEL
Anion gap: 11 (ref 5–15)
BUN: 7 mg/dL (ref 6–20)
CO2: 24 mmol/L (ref 22–32)
Calcium: 10.3 mg/dL (ref 8.9–10.3)
Chloride: 105 mmol/L (ref 101–111)
Creatinine, Ser: 0.87 mg/dL (ref 0.44–1.00)
GFR calc Af Amer: >60 mL/min (ref >60)
GFR calc non Af Amer: >60 mL/min (ref >60)
Glucose, Bld: 96 mg/dL (ref 65–99)
Potassium: 3.5 mmol/L (ref 3.5–5.1)
Sodium: 140 mmol/L (ref 135–145)

## 2015-02-04 LAB — CBC WITH DIFFERENTIAL/PLATELET
BASOS PCT: 0 % (ref 0–1)
Basophils Absolute: 0 10*3/uL (ref 0.0–0.1)
EOS ABS: 0.2 10*3/uL (ref 0.0–0.7)
Eosinophils Relative: 4 % (ref 0–5)
HCT: 42.1 % (ref 36.0–46.0)
Hemoglobin: 14 g/dL (ref 12.0–15.0)
LYMPHS ABS: 2.1 10*3/uL (ref 0.7–4.0)
Lymphocytes Relative: 39 % (ref 12–46)
MCH: 30.3 pg (ref 26.0–34.0)
MCHC: 33.3 g/dL (ref 30.0–36.0)
MCV: 91.1 fL (ref 78.0–100.0)
Monocytes Absolute: 0.4 10*3/uL (ref 0.1–1.0)
Monocytes Relative: 8 % (ref 3–12)
NEUTROS ABS: 2.6 10*3/uL (ref 1.7–7.7)
Neutrophils Relative %: 49 % (ref 43–77)
Platelets: 203 10*3/uL (ref 150–400)
RBC: 4.62 MIL/uL (ref 3.87–5.11)
RDW: 12.1 % (ref 11.5–15.5)
WBC: 5.3 10*3/uL (ref 4.0–10.5)

## 2015-02-04 LAB — I-STAT TROPONIN, ED
TROPONIN I, POC: 0 ng/mL (ref 0.00–0.08)
Troponin i, poc: 0 ng/mL (ref 0.00–0.08)

## 2015-02-04 MED ORDER — TECHNETIUM TC 99M DIETHYLENETRIAME-PENTAACETIC ACID: 40.0000 | Freq: Once | INTRAVENOUS | Status: DC | PRN

## 2015-02-04 MED ORDER — ONDANSETRON HCL 4 MG/2ML IJ SOLN
4.0000 mg | Freq: Once | INTRAMUSCULAR | Status: AC
  Administered 2015-02-04: 4 mg via INTRAVENOUS
  Filled 2015-02-04: qty 2

## 2015-02-04 MED ORDER — ENOXAPARIN SODIUM 100 MG/ML ~~LOC~~ SOLN
1.0000 mg/kg | Freq: Once | SUBCUTANEOUS | Status: AC
  Administered 2015-02-04: 90 mg via SUBCUTANEOUS
  Filled 2015-02-04: qty 1

## 2015-02-04 MED ORDER — TECHNETIUM TO 99M ALBUMIN AGGREGATED
6.0000 | Freq: Once | INTRAVENOUS | Status: AC | PRN
  Administered 2015-02-04: 6 via INTRAVENOUS

## 2015-02-04 MED ORDER — SODIUM CHLORIDE 0.9 % IV BOLUS (SEPSIS)
1000.0000 mL | Freq: Once | INTRAVENOUS | Status: AC
  Administered 2015-02-04: 1000 mL via INTRAVENOUS

## 2015-02-04 NOTE — ED Notes (Signed)
Pt had surgery on parathyroid on Tuesday. Pt sts she has been running an intermittent low grade fever since. Yesterday she noticed a slight cough with sharp intermittent cp. Pt also experiencing rash to L arm and swelling to incision area- sts it feels swollen to the inside and itches on the outside. Pt airway intact. Speaking in full sentences.

## 2015-02-04 NOTE — ED Provider Notes (Signed)
Asian feels a slight swelling in her throat onset yesterday. No difficulty speaking. Associated symptoms include subjective fever and pleuritic anterior chest pain which is intermittent, lasting several seconds at a time, nonradiating. Associated symptoms include cough, nonproductive. She also reports swelling pain and redness of left arm on exam no distress HEENT exam no facial asymmetry neck no swelling no tenderness there is no Steri-Strip wound without drainage. She's not hoarse. Lungs clear auscultation heart regular rate and rhythm abdomen nondistended nontender. Left upper extremity mildly reddened and tender at dorsal forearm and lateral aspect of upper arm. Radial pulse 2+ by mouth to all other extremities without redness or tenderness neurovascularly intact  Orlie Dakin, MD 02/04/15 1743

## 2015-02-04 NOTE — Discharge Instructions (Signed)
Phlebitis Phlebitis is soreness and swelling (inflammation) of a vein. This can occur in your arms, legs, or torso (trunk), as well as deeper inside your body. Phlebitis is usually not serious when it occurs close to the surface of the body. However, it can cause serious problems when it occurs in a vein deeper inside the body. CAUSES  Phlebitis can be triggered by various things, including:   Reduced blood flow through your veins. This can happen with:  Bed rest over a long period.  Long-distance travel.  Injury.  Surgery.  Being overweight (obese) or pregnant.  Having an IV tube put in the vein and getting certain medicines through the vein.  Cancer and cancer treatment.  Use of illegal drugs taken through the vein.  Inflammatory diseases.  Inherited (genetic) diseases that increase the risk of blood clots.  Hormone therapy, such as birth control pills. SIGNS AND SYMPTOMS   Red, tender, swollen, and painful area on your skin. Usually, the area will be long and narrow.  Firmness along the center of the affected area. This can indicate that a blood clot has formed.  Low-grade fever. DIAGNOSIS  A health care provider can usually diagnose phlebitis by examining the affected area and asking about your symptoms. To check for infection or blood clots, your health care provider may order blood tests or an ultrasound exam of the area. Blood tests and your family history may also indicate if you have an underlying genetic disease that causes blood clots. Occasionally, a piece of tissue is taken from the body (biopsy sample) if an unusual cause of phlebitis is suspected. TREATMENT  Treatment will vary depending on the severity of the condition and the area of the body affected. Treatment may include:  Use of a warm compress or heating pad.  Use of compression stockings or bandages.  Anti-inflammatory medicines.  Removal of any IV tube that may be causing the problem.  Medicines  that kill germs (antibiotics) if an infection is present.  Blood-thinning medicines if a blood clot is suspected or present.  In rare cases, surgery may be needed to remove damaged sections of vein. HOME CARE INSTRUCTIONS   Only take over-the-counter or prescription medicines as directed by your health care provider. Take all medicines exactly as prescribed.  Raise (elevate) the affected area above the level of your heart as directed by your health care provider.  Apply a warm compress or heating pad to the affected area as directed by your health care provider. Do not sleep with the heating pad.  Use compression stockings or bandages as directed. These will speed healing and prevent the condition from coming back.  If you are on blood thinners:  Get follow-up blood tests as directed by your health care provider.  Check with your health care provider before using any new medicines.  Carry a medical alert card or wear your medical alert jewelry to show that you are on blood thinners.  For phlebitis in the legs:  Avoid prolonged standing or bed rest.  Keep your legs moving. Raise your legs when sitting or lying.  Do not smoke.  Women, particularly those over the age of 86, should consider the risks and benefits of taking the contraceptive pill. This kind of hormone treatment can increase your risk for blood clots.  Follow up with your health care provider as directed. SEEK MEDICAL CARE IF:   You have unusual bruising or any bleeding problems.  Your swelling or pain in the affected area  is not improving.  You are on anti-inflammatory medicine, and you develop belly (abdominal) pain. SEEK IMMEDIATE MEDICAL CARE IF:   You have a sudden onset of chest pain or difficulty breathing.  You have a fever or persistent symptoms for more than 2-3 days.  You have a fever and your symptoms suddenly get worse. MAKE SURE YOU:  Understand these instructions.  Will watch your  condition.  Will get help right away if you are not doing well or get worse. Document Released: 08/20/2001 Document Revised: 06/16/2013 Document Reviewed: 05/03/2013 [facility] Patient Information 2015 [place], [place]. This information is not intended to replace advice given to you by your health care provider. Make sure you discuss any questions you have with your health care provider.

## 2015-02-04 NOTE — ED Provider Notes (Signed)
CSN: [id number]     Arrival date & time 02/04/15  1544 History   First MD Initiated Contact with Patient 02/04/15 1608     Chief Complaint  Patient presents with  . Oral Swelling  . Rash   [name] is a 59 y.o. female with a past medical history significant for hypertension, hyperlipidemia, DVT, GERD, and recent parathyroid surgery 5 days prior to arrival who presents with left arm pain, left arm redness, chest pain, nausea, subjective fevers, and neck pain. The patient reports that she had her parathyroid taken out on Tuesday and was discharged the next day. The patient reports that since that time she had had subjective fevers however, for the last 2 days she has had gradually worsening pain and redness of her left forearm and elbow. The patient denies any numbness or tingling that is new as well as weakness. The patient is right-handed. The patient also reports that last night while sitting in a chair, she had sudden onset of chest pain. The patient said that she has had intermittent chest pain since the onset and it only last several seconds at a time however it is sharp and pleuritic across her chest. The patient denies any exertional component to it. The patient denies shortness of breath. The patient reports nausea but no vomiting. The patient denies any other symptoms including denying abdominal pain, constipation, diarrhea, dysuria. The patient reports that her neck is slightly tender where the surgical scar resides. The patient denies any drainage or erythema around the surgical site.   (Consider location/radiation/quality/duration/timing/severity/associated sxs/prior Treatment) Patient is a 59 y.o. female presenting with chest pain. The history is provided by the patient and medical records. No language interpreter was used.  Chest Pain Pain location:  Substernal area Pain quality: sharp and shooting   Pain radiates to:  Does not radiate Pain radiates to the back: no   Pain  severity:  Severe Onset quality:  Sudden Duration:  2 days Timing:  Sporadic Progression:  Waxing and waning Chronicity:  New Context: at rest   Worsened by:  Deep breathing Ineffective treatments:  None tried Associated symptoms: fever (subjective) and nausea   Associated symptoms: no abdominal pain, no back pain, no cough, no diaphoresis, no dizziness, no headache, no lower extremity edema, no numbness, no palpitations, no shortness of breath, no syncope and not vomiting   Associated symptoms comment:  L upper arm pain/redness  Fever:    Duration:  3 days   Timing:  Intermittent   Temp source:  Subjective   Progression:  Waxing and waning Nausea:    Severity:  Mild   Onset quality:  Gradual Risk factors: prior DVT/PE and surgery     Past Medical History  Diagnosis Date  . Hypertension   . Fibromyalgia   . Depression   . Restless leg syndrome   . Chest pain   . Hyperlipidemia   . Hyperglycemia   . Anxiety   . PVD (peripheral vascular disease)   . DVT (deep venous thrombosis) 01/2006    "legs; after my gallbladder OR"  . Gallstones   . Arrhythmia     cardiac dysrhythmia, tachycardia  . GERD (gastroesophageal reflux disease)   . Hx of echocardiogram     Echo (11/15):  Mild LVH, EF 55-60%, GLPSS mildly reduced (-16%), Gr 1 DD  . Hx of echocardiogram     Lexiscan Myoview (11/15):  Low risk; Fixed, small mild apical anterior and apical inferolateral perfusion defects. Suspect soft  tissue attenuation given normal wall motion. No ischemia. EF 71%.  . Complication of anesthesia     hard to wake up --  gallbladder here @ [facility]  . Pneumonia X 2  . Anemia     "when I was little"  . Headache     "weekly" (01/31/2015)  . Arthritis     "lower back" (01/31/2015)  . Kidney stones   . Kidney anomaly, congenital     "falulty plumbing"  . Ovarian cancer     beginnings of ovarian cancer "both"   Past Surgical History  Procedure Laterality Date  . Wrist fracture surgery Left  2014  . Tonsillectomy    . Fracture surgery    . Cesarean section  1976; 1977; 1980  . Parathyroidectomy  01/31/2015  . Neck exploration  01/31/2015  . Carpal tunnel release Left   . Breast lumpectomy Right     "took out golf-ball sized growth"  . Bilateral salpingoophorectomy Bilateral   . Tubal ligation  1980  . Cardiac catheterization  2007  . Cholecystectomy  01/2006  . Parathyroidectomy N/A 01/31/2015    Procedure: NECK EXPLORATION WITH PARATHYROIDECTOMY;  Surgeon: Armandina Gemma, MD;  Location: [facility] OR;  Service: General;  Laterality: N/A;   Family History  Problem Relation Age of Onset  . Hypertension Mother   . Hypothyroidism Mother   . Prostate cancer Father   . Heart attack Brother   . Heart attack Maternal Grandmother   . Heart attack Maternal Grandfather   . Heart attack Paternal Grandfather   . Stroke Neg Hx    History  Substance Use Topics  . Smoking status: Former Smoker -- 25 years    Types: Cigarettes  . Smokeless tobacco: Never Used     Comment: "quit in smoking in ~ 2004"  . Alcohol Use: No   OB History    No data available     Review of Systems  Constitutional: Positive for fever (subjective). Negative for chills, diaphoresis and appetite change.  HENT: Negative for congestion.   Respiratory: Negative for cough, chest tightness, shortness of breath, wheezing and stridor.   Cardiovascular: Positive for chest pain. Negative for palpitations, leg swelling and syncope.  Gastrointestinal: Positive for nausea. Negative for vomiting, abdominal pain, diarrhea and constipation.  Genitourinary: Negative for flank pain.  Musculoskeletal: Positive for neck pain. Negative for back pain, gait problem and neck stiffness.  Skin: Positive for rash and wound.  Neurological: Negative for dizziness, numbness and headaches.  Psychiatric/Behavioral: Negative for agitation.  All other systems reviewed and are negative.     Allergies  Contrast media; Iohexol; and Bee  venom  Home Medications   Prior to Admission medications   Medication Sig Start Date End Date Taking? Authorizing Provider  aspirin EC 81 MG tablet Take 81 mg by mouth daily.    Historical Provider, MD  atenolol (TENORMIN) 25 MG tablet 1 tablet daily and an extra 1/2 tablet as needed for palpitations Patient taking differently: Take 25 mg by mouth daily.  11/30/13   Jettie Booze, MD  Cholecalciferol ([employer] VIT D 5000 HIGH-POTENCY) 5000 UNITS capsule Take 5,000 Units by mouth daily.    Historical Provider, MD  dexlansoprazole (DEXILANT) 60 MG capsule Take 60 mg by mouth daily.    Historical Provider, MD  enalapril (VASOTEC) 5 MG tablet 1 tablet daily and an extra tablet if systolic BP is higher than 150 Patient taking differently: Take 5 mg by mouth daily. 1 tablet daily and an extra  tablet if systolic BP is higher than 150 11/30/13   Jettie Booze, MD  EPINEPHrine (EPIPEN 2-PAK) 0.3 mg/0.3 mL SOAJ injection Inject into the muscle once.    Historical Provider, MD  escitalopram (LEXAPRO) 20 MG tablet Take 20 mg by mouth daily.  11/12/14   Historical Provider, MD  HYDROcodone-acetaminophen (NORCO/VICODIN) 5-325 MG per tablet Take 1-2 tablets by mouth every 4 (four) hours as needed for moderate pain. 02/01/15   Armandina Gemma, MD  ibuprofen (ADVIL,MOTRIN) 200 MG tablet Take 400-800 mg by mouth every 8 (eight) hours as needed for mild pain or moderate pain.    Historical Provider, MD  nitroGLYCERIN (NITROSTAT) 0.4 MG SL tablet Place 0.4 mg under the tongue every 5 (five) minutes as needed for chest pain.    Historical Provider, MD   BP 151/84 mmHg  Pulse 81  Temp(Src) 98.8 F (37.1 C)  Resp 18  Ht 5\' 2"  (1.575 m)  Wt 198 lb (89.812 kg)  BMI 36.21 kg/m2  SpO2 100% Physical Exam  Constitutional: She is oriented to person, place, and time. She appears well-developed and well-nourished. No distress.  HENT:  Head: Normocephalic and atraumatic.  Mouth/Throat: No oropharyngeal exudate.  Eyes:  Conjunctivae and EOM are normal. Pupils are equal, round, and reactive to light. No scleral icterus.  Neck: Normal range of motion.    Pt has surgical wound on transverse neck. No drainage or erythema. Mild tenderness at site.   Cardiovascular: Normal rate, regular rhythm, normal heart sounds and intact distal pulses.   No murmur heard. Pulmonary/Chest: Effort normal and breath sounds normal. No stridor. No respiratory distress. She has no wheezes.  Abdominal: Soft. Bowel sounds are normal. She exhibits no distension. There is no tenderness. There is no rebound and no guarding.  Musculoskeletal: She exhibits tenderness.       Left elbow: She exhibits swelling. Tenderness found.       Arms: Mild erythema on L forearm and tenderness. Palpable hard area felt under the skin.   Lymphadenopathy:    She has no cervical adenopathy.  Neurological: She is alert and oriented to person, place, and time. She displays normal reflexes. No cranial nerve deficit. She exhibits normal muscle tone.  Skin: Skin is warm. Rash noted. She is not diaphoretic. There is erythema.  Psychiatric: She has a normal mood and affect.  Vitals reviewed.   ED Course  Procedures (including critical care time) Labs Review Labs Reviewed  CBC WITH DIFFERENTIAL/PLATELET  BASIC METABOLIC PANEL  I-STAT [place], ED  I-STAT TROPOININ, ED  [facility] Idol, ED  [facility], ED    Imaging Review Dg Chest 2 View  02/04/2015   CLINICAL DATA:  Oral swelling, had parathyroid  surgery Tuesday  EXAM: CHEST  2 VIEW  COMPARISON:  01/06/2015  FINDINGS: Cardiomediastinal silhouette is stable. No acute infiltrate or pleural effusion. No pulmonary edema. Bony thorax is unremarkable.  IMPRESSION: No active cardiopulmonary disease.   Electronically Signed   By: Lahoma Crocker M.D.   On: 02/04/2015 16:47   Nm Pulmonary Perf And Vent  02/04/2015   CLINICAL DATA:  Chest pain  EXAM: NUCLEAR MEDICINE VENTILATION - PERFUSION LUNG SCAN   TECHNIQUE: Ventilation images were obtained in multiple projections using inhaled aerosol Tc-48m DTPA. Perfusion images were obtained in multiple projections after intravenous injection of Tc-65m MAA.  RADIOPHARMACEUTICALS:  40 mCi Technetium-89m DTPA aerosol inhalation and 6 mCi Technetium-24m MAA IV  COMPARISON:  Chest radiographs dated 01/27/2015  FINDINGS: Ventilation: Heterogeneous ventilation without focal  ventilation defect. GI uptake.  Perfusion: No wedge shaped peripheral perfusion defects to suggest acute pulmonary embolism.  Corresponding chest radiographs are clear.  IMPRESSION: No evidence of pulmonary embolism.   Electronically Signed   By: Julian Hy M.D.   On: 02/04/2015 20:04     EKG Interpretation   Date/Time:  Saturday Feb 04 2015 16:04:20 EDT Ventricular Rate:  81 PR Interval:  181 QRS Duration: 102 QT Interval:  394 QTC Calculation: 457 R Axis:   53 Text Interpretation:  Sinus rhythm Low voltage, precordial leads RSR' in  V1 or V2, right VCD or RVH Baseline wander in lead(s) I III aVR aVL V5 No  significant change since last tracing Confirmed by Winfred Leeds  MD, SAM  [phone]) on 02/04/2015 4:11:19 PM      MDM   Glo Herring is a 59 y.o. female with a past medical history significant for hypertension, hyperlipidemia, DVT, GERD, and recent parathyroid surgery 5 days prior to arrival who presents with left arm pain, left arm redness, chest pain, nausea, subjective fevers, and neck pain. On evaluation, the patient's surgical site appears well healing and normal. There is mild tenderness but no swelling, induration, or drainage concerning for wound infection. Inspection of the patient's left arm revealed redness, swelling, and tenderness which appeared to be consistent with a superficial thrombophlebitis.  Given the patient's history of DVT, recent surgery, appearance of thrombophlebitis, and chest pain, there is concern for possible pulmonary embolus secondary to a left  upper extremity DVT. Next  On chart review, the patient has an allergy to contrast media and as such, required a VQ scan to rule out pulmonary embolism. The patient's VQ scan did not show evidence of PE as seen above. Due to resources, the patient was unable to have a left upper extremity ultrasound performed during her visit. The patient was empirically given a dose of Lovenox and was scheduled to have a left upper extremity DVT study performed tomorrow.   the patient denied any further chest pain while in the emergency department. The patient did not have any other complaints while in the ED.  The patient was instructed to receive her DVT study tomorrow as well as take NSAIDs if her arm pain continued. The patient was also given instructions to use warm compresses on the superficial thromophlebitis. The patient's diagnostic laboratory testing revealed negative troponins 2, unremarkable CBC and unremarkable BMP. The patient's chest x-ray did not show any acute cardio pulmonary abnormality.  The patient did not have any other problems in the ED and was discharged with understanding of the plan for further workup of her arm. The patient was given instructions to return to the ED if she had any new or worsening symptoms. The patient was discharged in good condition.  This patient was seen with Dr. Winfred Leeds, emergency medicine attending.    Final diagnoses:  Chest pain  Pain of left upper extremity       Antony Blackbird, MD 02/05/15 0300  Orlie Dakin, MD 02/05/15 [id number]

## 2015-02-05 ENCOUNTER — Ambulatory Visit (HOSPITAL_COMMUNITY)
Admission: RE | Admit: 2015-02-05 | Discharge: 2015-02-05 | Disposition: A | Discharge status: Home / Self Care | Payer: BC Managed Care – PPO | Source: Ambulatory Visit | Attending: Emergency Medicine | Admitting: Emergency Medicine

## 2015-02-05 DIAGNOSIS — M79602 Pain in left arm: Secondary | ICD-10-CM | POA: Diagnosis present

## 2015-02-05 DIAGNOSIS — M79603 Pain in arm, unspecified: Secondary | ICD-10-CM | POA: Diagnosis not present

## 2015-02-05 DIAGNOSIS — I82612 Acute embolism and thrombosis of superficial veins of left upper extremity: Secondary | ICD-10-CM | POA: Diagnosis not present

## 2015-02-05 NOTE — Progress Notes (Signed)
VASCULAR LAB PRELIMINARY  PRELIMINARY  PRELIMINARY  PRELIMINARY  Left upper extremity venous Doppler completed.    Preliminary report:  There is no DVT noted in the left upper extremity. There is superficial thrombosis noted in the left cephalic vein from just below the AV to low forearm.  Carlton Sweaney, RVT 02/05/2015, 1:42 PM

## 2015-05-17 ENCOUNTER — Telehealth: Payer: Self-pay | Admitting: Internal Medicine

## 2015-05-17 NOTE — Telephone Encounter (Signed)
Pt called and would like for dr. Cruzita Lederer to call her ASAP because she see surgeon tomorrow and needs a couple of questions answer

## 2015-05-17 NOTE — Telephone Encounter (Signed)
Please read message below.  

## 2015-05-17 NOTE — Telephone Encounter (Signed)
I called and discussed with her.

## 2015-05-18 ENCOUNTER — Other Ambulatory Visit: Payer: Self-pay | Admitting: Surgery

## 2015-05-18 DIAGNOSIS — D351 Benign neoplasm of parathyroid gland: Secondary | ICD-10-CM

## 2015-05-19 ENCOUNTER — Other Ambulatory Visit: Payer: Self-pay | Admitting: Surgery

## 2015-05-24 ENCOUNTER — Ambulatory Visit: Payer: Self-pay | Admitting: Internal Medicine

## 2015-06-08 ENCOUNTER — Encounter (HOSPITAL_COMMUNITY)
Admission: RE | Admit: 2015-06-08 | Discharge: 2015-06-08 | Disposition: A | Discharge status: Home / Self Care | Payer: BC Managed Care – PPO | Source: Ambulatory Visit | Attending: Surgery | Admitting: Surgery

## 2015-06-08 DIAGNOSIS — D351 Benign neoplasm of parathyroid gland: Secondary | ICD-10-CM | POA: Diagnosis not present

## 2015-06-08 MED ORDER — TECHNETIUM TC 99M SESTAMIBI - CARDIOLITE
26.0000 | Freq: Once | INTRAVENOUS | Status: AC | PRN
  Administered 2015-06-08: 26 via INTRAVENOUS

## 2015-06-13 ENCOUNTER — Other Ambulatory Visit: Payer: Self-pay | Admitting: Surgery

## 2015-06-13 DIAGNOSIS — E892 Postprocedural hypoparathyroidism: Secondary | ICD-10-CM

## 2015-06-13 DIAGNOSIS — Z9889 Other specified postprocedural states: Secondary | ICD-10-CM

## 2015-06-14 ENCOUNTER — Other Ambulatory Visit: Payer: Self-pay | Admitting: Surgery

## 2015-06-14 DIAGNOSIS — E892 Postprocedural hypoparathyroidism: Secondary | ICD-10-CM

## 2015-06-14 DIAGNOSIS — Z9889 Other specified postprocedural states: Secondary | ICD-10-CM

## 2015-06-20 ENCOUNTER — Other Ambulatory Visit: Payer: Self-pay | Admitting: Surgery

## 2015-06-20 DIAGNOSIS — Z9889 Other specified postprocedural states: Secondary | ICD-10-CM

## 2015-06-20 DIAGNOSIS — E892 Postprocedural hypoparathyroidism: Secondary | ICD-10-CM

## 2015-06-21 ENCOUNTER — Ambulatory Visit: Payer: Self-pay | Admitting: Internal Medicine

## 2015-07-05 ENCOUNTER — Other Ambulatory Visit: Payer: Self-pay | Admitting: Surgery

## 2015-07-10 ENCOUNTER — Other Ambulatory Visit (HOSPITAL_COMMUNITY): Payer: Self-pay | Admitting: Surgery

## 2015-07-13 ENCOUNTER — Ambulatory Visit (HOSPITAL_COMMUNITY)
Admission: RE | Admit: 2015-07-13 | Discharge: 2015-07-13 | Disposition: A | Discharge status: Home / Self Care | Payer: BC Managed Care – PPO | Source: Ambulatory Visit | Attending: Surgery | Admitting: Surgery

## 2015-07-13 DIAGNOSIS — M47892 Other spondylosis, cervical region: Secondary | ICD-10-CM | POA: Insufficient documentation

## 2015-07-13 MED ORDER — GADOBENATE DIMEGLUMINE 529 MG/ML IV SOLN
20.0000 mL | Freq: Once | INTRAVENOUS | Status: AC | PRN
  Administered 2015-07-13: 18 mL via INTRAVENOUS

## 2015-07-25 ENCOUNTER — Ambulatory Visit (INDEPENDENT_AMBULATORY_CARE_PROVIDER_SITE_OTHER): Payer: BC Managed Care – PPO | Admitting: Internal Medicine

## 2015-07-25 ENCOUNTER — Encounter: Payer: Self-pay | Admitting: Internal Medicine

## 2015-07-25 VITALS — BP 122/80 | HR 73 | Temp 97.6°F | Resp 12 | Wt 197.8 lb

## 2015-07-25 DIAGNOSIS — E210 Primary hyperparathyroidism: Secondary | ICD-10-CM

## 2015-07-25 DIAGNOSIS — M8580 Other specified disorders of bone density and structure, unspecified site: Secondary | ICD-10-CM

## 2015-07-25 DIAGNOSIS — Z8639 Personal history of other endocrine, nutritional and metabolic disease: Secondary | ICD-10-CM | POA: Insufficient documentation

## 2015-07-25 LAB — VITAMIN D 25 HYDROXY (VIT D DEFICIENCY, FRACTURES): VITD: 42.71 ng/mL (ref 30.00–100.00)

## 2015-07-25 NOTE — Patient Instructions (Signed)
Please stop at the lab.  I referred you to [facility]: Dr Eliezer Champagne.  Please come back for a follow-up appointment in 6 months.

## 2015-07-25 NOTE — Progress Notes (Signed)
Patient ID: [name], female   DOB: [birth year], 59 y.o.   MRN: [id number]   HPI  [name] is a 59 y.o.-year-old female, returning for follow-up for hypercalcemia and h/o primary HPTH. Last visit 7 months ago.   This is mostly a counseling session to discuss the latest results and design a plan of action for her hypercalcemia and osteopenia.   Reviewed and addended history: Pt was dx with hypercalcemia when she presented to see PCP for fatigue, nausea, leg pains.  I reviewed pt's pertinent labs: Lab Results  Component Value Date   PTH 32 11/21/2014   PTH Comment 11/21/2014   PTH 30 06/13/2014   CALCIUM 10.3 02/04/2015   CALCIUM 9.6 02/01/2015   CALCIUM 10.5* 01/23/2015   CALCIUM 10.3* 11/21/2014   CALCIUM 10.1 06/13/2014   CALCIUM 9.2 04/04/2012   CALCIUM 10.0 04/03/2012  11/16/2014: Ca 11.3 05/20/2014: Ca 11 (8.7-10.2), PTH 44 (15-65) 07/28/2013: Ca 10.4  She broke her L wrist >> 2013 and L 5th metatarsal 01/2014.  We checked a DEXA scan - osteopenia except osteoporosis-range T score at 33% distal radius:  12/11/2014  Lumbar spine (L1-L4) Femoral neck (FN) 33% distal radius  T-score -1.9 RFN:-1.3 LFN:-2.0 -2.8   No h/o kidney stones. Her mother and GF had kidney stones.   No h/o CKD. Last BUN/Cr: Lab Results  Component Value Date   BUN 7 02/04/2015   CREATININE 0.87 02/04/2015   Pt is not on HCTZ.  Last vit D 38.9 in 05/20/2014. She has h/o vitamin D deficiency. She was on Ergocalciferol 50,000 units 1-2 years ago, now on 5000 units daily.   Patient continues to have fatigue, improved. No AP. Has constipation. Some depression, but mostly anxiety (better on Lexapro).  She has had mildly elevated calcium in the setting of a nonsuppressed PTH: Lab Results  Component Value Date   PTH 32 11/21/2014   PTH Comment 11/21/2014   PTH 30 06/13/2014   CALCIUM 10.3 02/04/2015   CALCIUM 9.6 02/01/2015   CALCIUM 10.5* 01/23/2015   CALCIUM 10.3* 11/21/2014    CALCIUM 10.1 06/13/2014   CALCIUM 9.2 04/04/2012  - 1,25 dihydroxy vitamin D was high, at 90 - Urinary calcium was high at 308 - PTHrp normal, at 16 (14-27).  She has h/o vit D def >> previously on Ergocalciferol, now on vit D3 OTC, 5000 IU daily.  Due to high suspicion for parathyroid adenoma, we checked:  07/13/2014: Tc sestamibi scan of the parathyroid glands: normal.  Patient then agreed to be referred to surgery for a second opinion. Dr. Harlow Asa ordered: 08/12/2014: Neck ultrasound, that returned negative for parathyroid adenoma.   Due to persistently high suspicion for primary hyperparathyroidism, she had parathyroid exploratory surgery, with right superior parathyroidectomy in 01/2015: Diagnosis 1. Parathyroid gland, right superior - HYPERCELLULAR PARATHYROID TISSUE AND THYROID TISSUE, 0.2 GRAMS. 2. Soft tissue, biopsy, right thyro-thymic tract - BENIGN THYMIC TISSUE. Enid Cutter MD Pathologist, [employer]  She had persistent high PTH and calcium after surgery. Dr. Harlow Asa ordered the following tests:  06/08/2015: Tc sestamibi scan of the parathyroid glands including the mediastinum:   No scintigraphic evidence of parathyroid adenoma  07/13/2015: MRI (Nuclear medicine parathyroid scintigraphy with SPECT imaging using sestamibi 06/08/2015:  No MR evidence for occult parathyroid adenoma on this motion degraded scan. It is unclear if CT of the neck with and without contrast according to parathyroid protocol would provide additional information, given the previous negative surgical exploration, negative sestamibi scan, as well  as lack of apparent findings on today's study.  Surgical Re-intervention was not considered necessary since an adenoma could be found on either the sestamibi scan or the ultrasound.  I discussed with Dr Harlow Asa about further options: Recent labs (06/2015) marginal with calcium 10.5 and PTH 60.     Discussed options:    1. observation  and monitoring    2. selective venous sampling procedure by interventional radiology    3. referral to tertiary center for second opinion and recommendations     ROS: Constitutional: no weight gain, + fatigue, + hot flushes Eyes: + blurry vision, no xerophthalmia ENT: no sore throat, no nodules palpated in throat, no dysphagia/odynophagia Cardiovascular: + CP/+ SOB/+ palpitations/no leg swelling Respiratory: no cough/+ SOB/no wheezing Gastrointestinal: + N/no V/D/C/ + heartburn Musculoskeletal: + muscle/+ joint and bone aches Skin: no rashes Neurological: no tremors/numbness/tingling/dizziness, + HA  I reviewed pt's medications, allergies, PMH, social hx, family hx, and changes were documented in the history of present illness. Otherwise, unchanged from my initial visit note:  Past Medical History  Diagnosis Date  . Hypertension   . Fibromyalgia   . Depression   . Restless leg syndrome   . Chest pain   . Hyperlipidemia   . Hyperglycemia   . Anxiety   . PVD (peripheral vascular disease)   . DVT (deep venous thrombosis) 01/2006    "legs; after my gallbladder OR"  . Gallstones   . Arrhythmia     cardiac dysrhythmia, tachycardia  . GERD (gastroesophageal reflux disease)   . Hx of echocardiogram     Echo (11/15):  Mild LVH, EF 55-60%, GLPSS mildly reduced (-16%), Gr 1 DD  . Hx of echocardiogram     Lexiscan Myoview (11/15):  Low risk; Fixed, small mild apical anterior and apical inferolateral perfusion defects. Suspect soft tissue attenuation given normal wall motion. No ischemia. EF 71%.  . Complication of anesthesia     hard to wake up --  gallbladder here @ [facility]  . Pneumonia X 2  . Anemia     "when I was little"  . Headache     "weekly" (01/31/2015)  . Arthritis     "lower back" (01/31/2015)  . Kidney stones   . Kidney anomaly, congenital     "falulty plumbing"  . Ovarian cancer     beginnings of ovarian cancer "both"   Past Surgical History  Procedure  Laterality Date  . Wrist fracture surgery Left 2014  . Tonsillectomy    . Fracture surgery    . Cesarean section  1976; 1977; 1980  . Parathyroidectomy  01/31/2015  . Neck exploration  01/31/2015  . Carpal tunnel release Left   . Breast lumpectomy Right     "took out golf-ball sized growth"  . Bilateral salpingoophorectomy Bilateral   . Tubal ligation  1980  . Cardiac catheterization  2007  . Cholecystectomy  01/2006  . Parathyroidectomy N/A 01/31/2015    Procedure: NECK EXPLORATION WITH PARATHYROIDECTOMY;  Surgeon: Armandina Gemma, MD;  Location: [facility];  Service: General;  Laterality: N/A;   History   Social History  . Marital Status: Single    Spouse Name: N/A    Number of Children: 2   Occupational History  . Consulting civil engineer   Social History Main Topics  . Smoking status: Former Smoker -- 27 years, quit in 2005    Types: Cigarettes  . Smokeless tobacco: No  . Alcohol Use: No  . Drug Use: No   Current Outpatient  Prescriptions on File Prior to Visit  Medication Sig Dispense Refill  . aspirin EC 81 MG tablet Take 81 mg by mouth daily.    Marland Kitchen atenolol (TENORMIN) 25 MG tablet 1 tablet daily and an extra 1/2 tablet as needed for palpitations (Patient taking differently: Take 25 mg by mouth daily. )    . Cholecalciferol ([employer] VIT D 5000 HIGH-POTENCY) 5000 UNITS capsule Take 5,000 Units by mouth daily.    Marland Kitchen dexlansoprazole (DEXILANT) 60 MG capsule Take 60 mg by mouth daily.    . enalapril (VASOTEC) 5 MG tablet 1 tablet daily and an extra tablet if systolic BP is higher than 150 (Patient taking differently: Take 5 mg by mouth daily. 1 tablet daily and an extra tablet if systolic BP is higher than 150)    . EPINEPHrine (EPIPEN 2-PAK) 0.3 mg/0.3 mL SOAJ injection Inject into the muscle once.    . escitalopram (LEXAPRO) 20 MG tablet Take 20 mg by mouth daily.     Marland Kitchen HYDROcodone-acetaminophen (NORCO/VICODIN) 5-325 MG per tablet Take 1-2 tablets by mouth every 4 (four) hours as needed for  moderate pain. 20 tablet 0  . ibuprofen (ADVIL,MOTRIN) 200 MG tablet Take 400-800 mg by mouth every 8 (eight) hours as needed for mild pain or moderate pain.    . nitroGLYCERIN (NITROSTAT) 0.4 MG SL tablet Place 0.4 mg under the tongue every 5 (five) minutes as needed for chest pain.     No current facility-administered medications on file prior to visit.   Allergies  Allergen Reactions  . Contrast Media [Iodinated Diagnostic Agents] Anaphylaxis    IV dyes  . Iohexol Shortness Of Breath  . Bee Venom Swelling   Family History  Problem Relation Age of Onset  . Hypertension Mother   . Hypothyroidism Mother   . Prostate cancer Father   . Heart attack Brother   . Heart attack Maternal Grandmother   . Heart attack Maternal Grandfather   . Heart attack Paternal Grandfather   . Stroke Neg Hx    PE: BP 122/80 mmHg  Pulse 73  Temp(Src) 97.6 F (36.4 C) (Oral)  Resp 12  Wt 197 lb 12.8 oz (89.721 kg)  SpO2 99% Body mass index is 36.17 kg/(m^2).  Wt Readings from Last 3 Encounters:  07/25/15 197 lb 12.8 oz (89.721 kg)  07/13/15 195 lb (88.451 kg)  02/04/15 198 lb (89.812 kg)   Constitutional: overweight, in NAD. No kyphosis. Eyes: PERRLA, EOMI, no exophthalmos ENT: moist mucous membranes, no thyromegaly, no cervical lymphadenopathy Cardiovascular: RRR, No MRG Respiratory: CTA B Gastrointestinal: abdomen soft, NT, ND, BS+ Musculoskeletal: no deformities, strength intact in all 4 Skin: moist, warm, no rashes Neurological: no tremor with outstretched hands, DTR normal in all 4  Assessment: 1. Hypercalcemia - inappropriately normal PTH  Component     Latest Ref Rng 06/13/2014  Sodium     135 - 145 mEq/L 140  Potassium     3.5 - 5.3 mEq/L 4.0  Chloride     96 - 112 mEq/L 106  CO2     19 - 32 mEq/L 23  Glucose     70 - 99 mg/dL 94  BUN     6 - 23 mg/dL 14  Creatinine     0.50 - 1.10 mg/dL 1.10  Calcium     8.4 - 10.5 mg/dL 10.1  Vitamin D 1, 25 (OH) Total     18 -  72 pg/mL 90 (H)  Vitamin D3 1, 25 (OH)  90  Vitamin D2 1, 25 (OH)      <8  Phosphorus     2.3 - 4.6 mg/dL 3.1  Magnesium     1.5 - 2.5 mg/dL 2.1  PTH     15 - 65 pg/mL 30   Component     Latest Ref Rng 06/15/2014  Calcium, Ur      14  Calcium, 24 hour urine     100 - 250 mg/day 308 (H)  Creatinine, Urine      77.7  Creatinine, 24H Ur     700 - 1800 mg/day 1709   Normal calcium, normal PTH. High 1,25 diHO vitamin D, normal phos and Mg. High 24h UCa.  08/12/2014: Thyroid U/S: Solitary small nodule in the right lobe measuring 4 mm. No parathyroid adenoma  2. Osteopenia - On the DEXA scan obtained on 12/11/2014  - She has the lowest bone mineral density at the level of the 33% distal radius, which is usually seen in hyperparathyroidism   3. H/o vit D deficiency  Plan: This was mostly a counseling session, in which we discussed about her previous investigation, both blood work, urine tests and imaging tests.   1. Patient with clinical and lab picture indicative of primary hyperparathyroidism, however, there without an associated lesion on the imaging tests. She had the right superior parathyroidectomy 6 months ago, with persistently high calcium and nonsuppressed PTH. Postoperative investigation with technetium sestamibi scan and MRI of the chest and neck was negative for parathyroid adenomas or ectopic parathyroid glands  I discussed with Dr. Harlow Asa about the case and we concluded that the possible options at this point are: - Expectant management - Obtain a 4D CT, That has a 50-50 chance to show an adenoma  - Proceed with neck surgical exploration in hopes to find a parathyroid adenoma - Referral to a tertiary center - I think another option would be to start bisphosphonates to lower her calcium levels and improve her bone density. However, this is basically a palliative measure. - Patient weighed in the above options, and she would like to go ahead with referral to a  tertiary center. I referred her to Dr. Elesa Massed at [facility] who is an expert in endocrine surgery including thyroid and parathyroid conditions.   2. Osteopenia - We discussed about her recent diagnosis of low bone mineral density in the fact that the lowest one was found at the level of the 33% distal radius, which is usually seen with hyperparathyroidism, and is not typical for postmenopausal bone density decrease.  - If she undergoes surgical adenoma excision, I would like to repeat her bone density scan in 2 years to see if her BMD improves. However, if she does not undergo excision, then we might need to start bisphosphonates.  3. History of vitamin D deficiency - Will check a vitamin D level today   Orders Placed This Encounter  Procedures  . Vitamin D, 25-hydroxy  . Ambulatory referral to General Surgery   - time spent with the patient: 40 minutes, of which >50% was spent in reviewing her previous labs and evaluations, counseling her about the results (please see the discussed topics above), and developing a plan to further investigate and treat her hypercalcemia and osteopenia; she had a number of questions which I addressed.  Return in about 6 months (around 01/22/2016).  Vit D (07/25/2015) = 42.71 (normal)

## 2015-08-31 ENCOUNTER — Telehealth: Payer: Self-pay | Admitting: Internal Medicine

## 2015-08-31 NOTE — Telephone Encounter (Signed)
Patient stated that she got her results back form [facility] and would like to talk to Dr Cruzita Lederer about it to see what she has to say, please advise

## 2015-08-31 NOTE — Telephone Encounter (Signed)
Please read message below.  

## 2015-08-31 NOTE — Telephone Encounter (Signed)
I tried to call her but there was no answer and did not want to leave a message. I will retry.

## 2015-09-01 ENCOUNTER — Encounter: Payer: Self-pay | Admitting: Internal Medicine

## 2015-09-01 NOTE — Progress Notes (Signed)
Patient called to inform us that she had her visit with Dr. Elesa Massed - [facility].  Labs obtained 08/29/2015: -  Calcium 10.5 (8.7-10.2) -  Ionized calcium 1.39 (1.15-1.32) -  PTH 107 (14-72) -  Vitamin D normal, but she did not have the value  She was referred 4D CT and a new  Thyroid uptake and scan.  She had another appointment with Dr. Elesa Massed next week.

## 2016-01-22 ENCOUNTER — Ambulatory Visit: Payer: Self-pay | Admitting: Internal Medicine

## 2016-04-04 ENCOUNTER — Other Ambulatory Visit: Payer: Self-pay | Admitting: Family Medicine

## 2016-04-04 ENCOUNTER — Ambulatory Visit
Admission: RE | Admit: 2016-04-04 | Discharge: 2016-04-04 | Disposition: A | Discharge status: Home / Self Care | Payer: BC Managed Care – PPO | Source: Ambulatory Visit | Attending: Family Medicine | Admitting: Family Medicine

## 2016-04-04 DIAGNOSIS — R319 Hematuria, unspecified: Secondary | ICD-10-CM

## 2016-04-05 ENCOUNTER — Other Ambulatory Visit: Payer: Self-pay | Admitting: Family Medicine

## 2016-04-05 DIAGNOSIS — R1903 Right lower quadrant abdominal swelling, mass and lump: Secondary | ICD-10-CM

## 2016-04-08 ENCOUNTER — Ambulatory Visit
Admission: RE | Admit: 2016-04-08 | Discharge: 2016-04-08 | Disposition: A | Discharge status: Home / Self Care | Payer: BC Managed Care – PPO | Source: Ambulatory Visit | Attending: Family Medicine | Admitting: Family Medicine

## 2016-04-08 DIAGNOSIS — R1903 Right lower quadrant abdominal swelling, mass and lump: Secondary | ICD-10-CM

## 2016-04-11 ENCOUNTER — Encounter (HOSPITAL_COMMUNITY): Payer: Self-pay | Admitting: *Deleted

## 2016-04-11 ENCOUNTER — Emergency Department (HOSPITAL_COMMUNITY)
Admission: EM | Admit: 2016-04-11 | Discharge: 2016-04-12 | Disposition: A | Discharge status: Home / Self Care | Payer: BC Managed Care – PPO | Attending: Emergency Medicine | Admitting: Emergency Medicine

## 2016-04-11 DIAGNOSIS — Z87891 Personal history of nicotine dependence: Secondary | ICD-10-CM | POA: Diagnosis not present

## 2016-04-11 DIAGNOSIS — R1030 Lower abdominal pain, unspecified: Secondary | ICD-10-CM

## 2016-04-11 DIAGNOSIS — Z7982 Long term (current) use of aspirin: Secondary | ICD-10-CM | POA: Insufficient documentation

## 2016-04-11 DIAGNOSIS — Z8543 Personal history of malignant neoplasm of ovary: Secondary | ICD-10-CM | POA: Insufficient documentation

## 2016-04-11 DIAGNOSIS — I10 Essential (primary) hypertension: Secondary | ICD-10-CM | POA: Insufficient documentation

## 2016-04-11 HISTORY — DX: Disorder of parathyroid gland, unspecified: E21.5

## 2016-04-11 LAB — COMPREHENSIVE METABOLIC PANEL
ALT: 27 U/L (ref 14–54)
AST: 21 U/L (ref 15–41)
Albumin: 3.9 g/dL (ref 3.5–5.0)
Alkaline Phosphatase: 76 U/L (ref 38–126)
Anion gap: 5 (ref 5–15)
BILIRUBIN TOTAL: 0.6 mg/dL (ref 0.3–1.2)
BUN: 12 mg/dL (ref 6–20)
CALCIUM: 10.5 mg/dL — AB (ref 8.9–10.3)
CO2: 26 mmol/L (ref 22–32)
CREATININE: 1.05 mg/dL — AB (ref 0.44–1.00)
Chloride: 108 mmol/L (ref 101–111)
GFR, EST NON AFRICAN AMERICAN: 58 mL/min — AB (ref >60)
Glucose, Bld: 87 mg/dL (ref 65–99)
Potassium: 3.8 mmol/L (ref 3.5–5.1)
Sodium: 139 mmol/L (ref 135–145)
TOTAL PROTEIN: 6.2 g/dL — AB (ref 6.5–8.1)

## 2016-04-11 LAB — URINALYSIS, ROUTINE W REFLEX MICROSCOPIC
Bilirubin Urine: NEGATIVE
GLUCOSE, UA: NEGATIVE mg/dL
Ketones, ur: NEGATIVE mg/dL
NITRITE: NEGATIVE
PH: 6.5 (ref 5.0–8.0)
Protein, ur: 30 mg/dL — AB
SPECIFIC GRAVITY, URINE: 1.024 (ref 1.005–1.030)

## 2016-04-11 LAB — CBC
HCT: 44.6 % (ref 36.0–46.0)
Hemoglobin: 14.4 g/dL (ref 12.0–15.0)
MCH: 30.3 pg (ref 26.0–34.0)
MCHC: 32.3 g/dL (ref 30.0–36.0)
MCV: 93.9 fL (ref 78.0–100.0)
PLATELETS: 206 10*3/uL (ref 150–400)
RBC: 4.75 MIL/uL (ref 3.87–5.11)
RDW: 12 % (ref 11.5–15.5)
WBC: 5.8 10*3/uL (ref 4.0–10.5)

## 2016-04-11 LAB — URINE MICROSCOPIC-ADD ON

## 2016-04-11 LAB — LIPASE, BLOOD: Lipase: 65 U/L — ABNORMAL HIGH (ref 11–51)

## 2016-04-11 MED ORDER — ONDANSETRON HCL 4 MG/2ML IJ SOLN
4.0000 mg | Freq: Once | INTRAMUSCULAR | Status: AC
  Administered 2016-04-11: 4 mg via INTRAVENOUS
  Filled 2016-04-11: qty 2

## 2016-04-11 MED ORDER — GI COCKTAIL ~~LOC~~
30.0000 mL | Freq: Once | ORAL | Status: AC
  Administered 2016-04-11: 30 mL via ORAL
  Filled 2016-04-11: qty 30

## 2016-04-11 MED ORDER — ONDANSETRON 4 MG PO TBDP
4.0000 mg | ORAL_TABLET | Freq: Once | ORAL | Status: AC | PRN
  Administered 2016-04-11: 4 mg via ORAL

## 2016-04-11 MED ORDER — HYDROMORPHONE HCL 2 MG PO TABS: 1.0000 mg | ORAL_TABLET | Freq: Four times a day (QID) | ORAL | 0 refills | Status: DC | PRN

## 2016-04-11 MED ORDER — SODIUM CHLORIDE 0.9 % IV BOLUS (SEPSIS)
1000.0000 mL | Freq: Once | INTRAVENOUS | Status: AC
  Administered 2016-04-11: 1000 mL via INTRAVENOUS

## 2016-04-11 MED ORDER — HYDROMORPHONE HCL 2 MG PO TABS
1.0000 mg | ORAL_TABLET | Freq: Once | ORAL | Status: AC
  Administered 2016-04-11: 1 mg via ORAL
  Filled 2016-04-11: qty 1

## 2016-04-11 MED ORDER — MORPHINE SULFATE (PF) 4 MG/ML IV SOLN
4.0000 mg | Freq: Once | INTRAVENOUS | Status: AC
  Administered 2016-04-11: 4 mg via INTRAVENOUS
  Filled 2016-04-11: qty 1

## 2016-04-11 MED ORDER — ONDANSETRON 4 MG PO TBDP
ORAL_TABLET | ORAL | Status: AC
  Filled 2016-04-11: qty 1

## 2016-04-11 NOTE — ED Notes (Signed)
PA-C at bedside 

## 2016-04-11 NOTE — ED Triage Notes (Addendum)
Pt reports constant lower abd pressure/pain for over a week. Has been to pcp and had ct scan and US done, mass was found. Pt has appt next week but unable to handle the pain. Having nausea.

## 2016-04-11 NOTE — Discharge Instructions (Signed)
Stop taking her tramadol and hydrocodone. Only take Dilaudid. Combinations of these medications can cause severe respiratory depression and/or death. Please follow-up with your primary care physician for continued pain control as well as your gynecologist next week.

## 2016-04-11 NOTE — ED Notes (Signed)
Patient began complaining of pressure in her abdomen after ambulating to restroom and back, then nausea and epigastric pain. Lonn Georgia, PA-C aware, obtaining EKG now.

## 2016-04-11 NOTE — ED Provider Notes (Signed)
[facility] DEPT Provider Note   CSN: [id number] Arrival date & time: 04/11/16  1731  First Provider Contact:  First MD Initiated Contact with Patient 04/11/16 2055        History   Chief Complaint Chief Complaint  Patient presents with  . Abdominal Pain    HPI [name] is a 60 y.o. female.  HPI [name] is a 60 y.o. female with PMH significant for GERD, HLD, hyperglycemia, HTN who presents with 5-6 week history of gradual onset, constant, dull, aching lower abdominal pain.  Patient reports she saw her PCP last week for this and found to have a pelvic mass on CT and pelvic ultrasound.  She has a follow up appointment with OB/GYN in 6 days.  She has been taking Tramadol and Hydrocodone without relief.  She presents today for pain control.  Denies new symptoms.  No vomiting, diarrhea, fever, chills, urinary symptoms, vaginal discharge/bleeding.  No modifying or aggravating factors.   Past Medical History:  Diagnosis Date  . Anemia    "when I was little"  . Anxiety   . Arrhythmia    cardiac dysrhythmia, tachycardia  . Arthritis    "lower back" (01/31/2015)  . Chest pain   . Complication of anesthesia    hard to wake up --  gallbladder here @ [facility]  . Depression   . DVT (deep venous thrombosis) ([place]) 01/2006   "legs; after my gallbladder OR"  . Fibromyalgia   . Gallstones   . GERD (gastroesophageal reflux disease)   . Headache    "weekly" (01/31/2015)  . Hx of echocardiogram    Echo (11/15):  Mild LVH, EF 55-60%, GLPSS mildly reduced (-16%), Gr 1 DD  . Hx of echocardiogram    Lexiscan Myoview (11/15):  Low risk; Fixed, small mild apical anterior and apical inferolateral perfusion defects. Suspect soft tissue attenuation given normal wall motion. No ischemia. EF 71%.  . Hyperglycemia   . Hyperlipidemia   . Hypertension   . Kidney anomaly, congenital    "falulty plumbing"  . Kidney stones   . Ovarian cancer ([place])    beginnings of ovarian cancer "both"  .  Parathyroid abnormality ([place])   . Pneumonia X 2  . PVD (peripheral vascular disease) ([place])   . Restless leg syndrome     Patient Active Problem List   Diagnosis Date Noted  . H/O vitamin D deficiency 07/25/2015  . Primary hyperparathyroidism ([place]) 01/31/2015  . Osteopenia 12/20/2014  . Hypercalcemia 06/13/2014  . Mixed hyperlipidemia 11/30/2013  . Essential hypertension, benign 11/30/2013  . Fibromyalgia   . Depression   . Restless leg syndrome     Past Surgical History:  Procedure Laterality Date  . BILATERAL SALPINGOOPHORECTOMY Bilateral   . BREAST LUMPECTOMY Right    "took out golf-ball sized growth"  . CARDIAC CATHETERIZATION  2007  . CARPAL TUNNEL RELEASE Left   . CESAREAN SECTION  1976; 1977; 1980  . CHOLECYSTECTOMY  01/2006  . FRACTURE SURGERY    . NECK EXPLORATION  01/31/2015  . PARATHYROIDECTOMY  01/31/2015  . PARATHYROIDECTOMY N/A 01/31/2015   Procedure: NECK EXPLORATION WITH PARATHYROIDECTOMY;  Surgeon: Armandina Gemma, MD;  Location: [facility];  Service: General;  Laterality: N/A;  . TONSILLECTOMY    . TUBAL LIGATION  1980  . WRIST FRACTURE SURGERY Left 2014    OB History    No data available       Home Medications    Prior to Admission medications   Medication  Sig Start Date End Date Taking? Authorizing Provider  aspirin EC 81 MG tablet Take 81 mg by mouth daily.   Yes Historical Provider, MD  atenolol (TENORMIN) 25 MG tablet 1 tablet daily and an extra 1/2 tablet as needed for palpitations Patient taking differently: Take 25 mg by mouth daily.  11/30/13  Yes Jettie Booze, MD  Cholecalciferol ([employer] VIT D 5000 HIGH-POTENCY) 5000 UNITS capsule Take 5,000 Units by mouth daily.   Yes Historical Provider, MD  dexlansoprazole (DEXILANT) 60 MG capsule Take 60 mg by mouth daily.   Yes Historical Provider, MD  enalapril (VASOTEC) 5 MG tablet 1 tablet daily and an extra tablet if systolic BP is higher than 150 Patient taking differently: Take 5 mg by mouth daily. 1  tablet daily and an extra tablet if systolic BP is higher than 150 11/30/13  Yes Jettie Booze, MD  EPINEPHrine (EPIPEN 2-PAK) 0.3 mg/0.3 mL SOAJ injection Inject 0.3 mg into the muscle daily as needed (allergic reaction).    Yes Historical Provider, MD  escitalopram (LEXAPRO) 20 MG tablet Take 20 mg by mouth daily.  11/12/14  Yes Historical Provider, MD  ibuprofen (ADVIL,MOTRIN) 200 MG tablet Take 400-800 mg by mouth every 8 (eight) hours as needed for mild pain or moderate pain.   Yes Historical Provider, MD  nitroGLYCERIN (NITROSTAT) 0.4 MG SL tablet Place 0.4 mg under the tongue every 5 (five) minutes as needed for chest pain.   Yes Historical Provider, MD  HYDROmorphone (DILAUDID) 2 MG tablet Take 0.5 tablets (1 mg total) by mouth every 6 (six) hours as needed for severe pain. 04/11/16   Gloriann Loan, PA-C    Family History Family History  Problem Relation Age of Onset  . Hypertension Mother   . Hypothyroidism Mother   . Prostate cancer Father   . Heart attack Brother   . Heart attack Maternal Grandmother   . Heart attack Maternal Grandfather   . Heart attack Paternal Grandfather   . Stroke Neg Hx     Social History Social History  Substance Use Topics  . Smoking status: Former Smoker    Years: 25.00    Types: Cigarettes  . Smokeless tobacco: Never Used     Comment: "quit in smoking in ~ 2004"  . Alcohol use No     Allergies   Contrast media [iodinated diagnostic agents]; Iohexol; and Bee venom   Review of Systems Review of Systems All other systems negative unless otherwise stated in HPI   Physical Exam Updated Vital Signs BP 119/72 (BP Location: Left Arm)   Pulse (!) 56   Temp 97.7 F (36.5 C) (Oral)   Resp 12   SpO2 97%   Physical Exam  Constitutional: She is oriented to person, place, and time. She appears well-developed and well-nourished.  Non-toxic appearance. She does not have a sickly appearance. She does not appear ill.  HENT:  Head: Normocephalic  and atraumatic.  Mouth/Throat: Oropharynx is clear and moist.  Eyes: Conjunctivae are normal.  Neck: Normal range of motion. Neck supple.  Cardiovascular: Normal rate and regular rhythm.   Pulmonary/Chest: Effort normal and breath sounds normal. No accessory muscle usage or stridor. No respiratory distress. She has no wheezes. She has no rhonchi. She has no rales.  Abdominal: Soft. Bowel sounds are normal. She exhibits no distension and no mass. There is tenderness in the suprapubic area. There is no guarding.  Musculoskeletal: Normal range of motion.  Lymphadenopathy:    She has no  cervical adenopathy.  Neurological: She is alert and oriented to person, place, and time.  Speech clear without dysarthria.  Skin: Skin is warm and dry.  Psychiatric: She has a normal mood and affect. Her behavior is normal.     ED Treatments / Results  Labs (all labs ordered are listed, but only abnormal results are displayed) Labs Reviewed  LIPASE, BLOOD - Abnormal; Notable for the following:       Result Value   Lipase 65 (*)    All other components within normal limits  COMPREHENSIVE METABOLIC PANEL - Abnormal; Notable for the following:    Creatinine, Ser 1.05 (*)    Calcium 10.5 (*)    Total Protein 6.2 (*)    GFR calc non Af Amer 58 (*)    All other components within normal limits  URINALYSIS, ROUTINE W REFLEX MICROSCOPIC (NOT AT [facility]) - Abnormal; Notable for the following:    APPearance CLOUDY (*)    Hgb urine dipstick LARGE (*)    Protein, ur 30 (*)    Leukocytes, UA SMALL (*)    All other components within normal limits  URINE MICROSCOPIC-ADD ON - Abnormal; Notable for the following:    Squamous Epithelial / LPF 0-5 (*)    Bacteria, UA RARE (*)    Crystals CA OXALATE CRYSTALS (*)    All other components within normal limits  CBC    EKG  EKG Interpretation  Date/Time:  Thursday April 11 2016 22:21:26 EDT Ventricular Rate:  58 PR Interval:    QRS Duration: 101 QT  Interval:  431 QTC Calculation: 424 R Axis:   49 Text Interpretation:  Sinus rhythm Low voltage, precordial leads RSR' in V1 or V2, right VCD or RVH Abnormal T, consider ischemia, anterior leads agree. slight increased t wave inversion in V3 and V4 Confirmed by Johnney Killian, MD, Jeannie Done [phone]) on 04/11/2016 10:29:06 PM       Radiology No results found.  Procedures Procedures (including critical care time)  Medications Ordered in ED Medications  ondansetron (ZOFRAN-ODT) disintegrating tablet 4 mg (4 mg Oral Given 04/11/16 1743)  sodium chloride 0.9 % bolus 1,000 mL (0 mLs Intravenous Stopped 04/11/16 2249)  morphine 4 MG/ML injection 4 mg (4 mg Intravenous Given 04/11/16 2134)  ondansetron (ZOFRAN) injection 4 mg (4 mg Intravenous Given 04/11/16 2218)  gi cocktail (Maalox,Lidocaine,Donnatal) (30 mLs Oral Given 04/11/16 2220)  HYDROmorphone (DILAUDID) tablet 1 mg (1 mg Oral Given 04/11/16 2257)     Initial Impression / Assessment and Plan / ED Course  I have reviewed the triage vital signs and the nursing notes.  Pertinent labs & imaging results that were available during my care of the patient were reviewed by me and considered in my medical decision making (see chart for details).  Clinical Course  Comment By Time  Patient began experiencing cramping epigastric abdominal pain, TTP, and relieved with sitting upright in bed.  Likely gas or GI related.  EKG did not show acute changes or changes from previous.  With improvement after GI cocktail and PO pain medicine.  Gloriann Loan, PA-C 08/03 2215   Patient presents with ongoing lower abdominal pain. She has been worked up by her PCP for this complaint. Patient denies any new changes. She just states that the current pain regimen is not working. This consists of tramadol and hydrocodone. Chart review shows a CT abdomen and pelvis performed 04/04/2016 which showed a cystic mass midline into the left near the uterus. Follow-up pelvic ultrasound showed  a large  cystic mass. She has a follow-up appointment with gynecology in 6 days. She denies any chest pain or shortness of breath. On exam, patient appears well but in pain. Heart RRR, lungs CTAB, abdomen soft with suprapubic tenderness. No rebound, guarding, rigidity. Lab work without acute abnormalities. She received IV morphine and fluids with some relief. She did experience some GI disturbance likely related to gas. This was relieved with a GI cocktail. She did receive 1 dose by mouth Dilaudid with resolution of pain. Plan to discharge home with by mouth Dilaudid. Advised patient to discontinue tramadol and hydrocodone. Follow-up PCP as soon as possible for pain control. Follow-up with gynecology as scheduled. Return precautions discussed. Patient agrees and acknowledges the above plan for discharge.  Case has been discussed with and seen by Dr. Johnney Killian who agrees with the above plan for discharge.   Final Clinical Impressions(s) / ED Diagnoses   Final diagnoses:  Lower abdominal pain    New Prescriptions New Prescriptions   HYDROMORPHONE (DILAUDID) 2 MG TABLET    Take 0.5 tablets (1 mg total) by mouth every 6 (six) hours as needed for severe pain.     Gloriann Loan, PA-C 04/11/16 [id number]    Charlesetta Shanks, MD 04/11/16 [phone]

## 2016-04-12 NOTE — ED Notes (Signed)
Patient verbalized understanding of discharge instructions and denies any further needs or questions at this time. VS stable. Patient ambulatory with steady gait. RN escorted patient to ED entrance in wheelchair, where daughter picked her up.

## 2016-05-08 ENCOUNTER — Encounter: Payer: Self-pay | Admitting: Gynecologic Oncology

## 2016-05-08 ENCOUNTER — Telehealth: Payer: Self-pay | Admitting: Gynecologic Oncology

## 2016-05-08 ENCOUNTER — Ambulatory Visit: Payer: BC Managed Care – PPO | Attending: Gynecologic Oncology | Admitting: Gynecologic Oncology

## 2016-05-08 VITALS — BP 137/69 | HR 68 | Temp 97.9°F | Resp 18 | Ht 62.0 in | Wt 199.4 lb

## 2016-05-08 DIAGNOSIS — Z87442 Personal history of urinary calculi: Secondary | ICD-10-CM | POA: Insufficient documentation

## 2016-05-08 DIAGNOSIS — R000 Tachycardia, unspecified: Secondary | ICD-10-CM | POA: Diagnosis not present

## 2016-05-08 DIAGNOSIS — G2581 Restless legs syndrome: Secondary | ICD-10-CM | POA: Diagnosis not present

## 2016-05-08 DIAGNOSIS — N838 Other noninflammatory disorders of ovary, fallopian tube and broad ligament: Secondary | ICD-10-CM

## 2016-05-08 DIAGNOSIS — E785 Hyperlipidemia, unspecified: Secondary | ICD-10-CM | POA: Insufficient documentation

## 2016-05-08 DIAGNOSIS — F418 Other specified anxiety disorders: Secondary | ICD-10-CM | POA: Insufficient documentation

## 2016-05-08 DIAGNOSIS — N839 Noninflammatory disorder of ovary, fallopian tube and broad ligament, unspecified: Secondary | ICD-10-CM | POA: Diagnosis not present

## 2016-05-08 DIAGNOSIS — Z7982 Long term (current) use of aspirin: Secondary | ICD-10-CM | POA: Insufficient documentation

## 2016-05-08 DIAGNOSIS — M797 Fibromyalgia: Secondary | ICD-10-CM | POA: Insufficient documentation

## 2016-05-08 DIAGNOSIS — N049 Nephrotic syndrome with unspecified morphologic changes: Secondary | ICD-10-CM | POA: Insufficient documentation

## 2016-05-08 DIAGNOSIS — Z87891 Personal history of nicotine dependence: Secondary | ICD-10-CM | POA: Diagnosis not present

## 2016-05-08 DIAGNOSIS — Z86718 Personal history of other venous thrombosis and embolism: Secondary | ICD-10-CM | POA: Insufficient documentation

## 2016-05-08 DIAGNOSIS — Z8543 Personal history of malignant neoplasm of ovary: Secondary | ICD-10-CM | POA: Diagnosis not present

## 2016-05-08 DIAGNOSIS — K219 Gastro-esophageal reflux disease without esophagitis: Secondary | ICD-10-CM | POA: Diagnosis not present

## 2016-05-08 DIAGNOSIS — Z888 Allergy status to other drugs, medicaments and biological substances status: Secondary | ICD-10-CM | POA: Diagnosis not present

## 2016-05-08 DIAGNOSIS — Z823 Family history of stroke: Secondary | ICD-10-CM | POA: Diagnosis not present

## 2016-05-08 DIAGNOSIS — Z91030 Bee allergy status: Secondary | ICD-10-CM | POA: Diagnosis not present

## 2016-05-08 DIAGNOSIS — I10 Essential (primary) hypertension: Secondary | ICD-10-CM | POA: Insufficient documentation

## 2016-05-08 DIAGNOSIS — M1990 Unspecified osteoarthritis, unspecified site: Secondary | ICD-10-CM | POA: Insufficient documentation

## 2016-05-08 DIAGNOSIS — Z9851 Tubal ligation status: Secondary | ICD-10-CM | POA: Diagnosis not present

## 2016-05-08 DIAGNOSIS — Z8042 Family history of malignant neoplasm of prostate: Secondary | ICD-10-CM | POA: Insufficient documentation

## 2016-05-08 DIAGNOSIS — Z9049 Acquired absence of other specified parts of digestive tract: Secondary | ICD-10-CM | POA: Insufficient documentation

## 2016-05-08 DIAGNOSIS — I739 Peripheral vascular disease, unspecified: Secondary | ICD-10-CM | POA: Insufficient documentation

## 2016-05-08 DIAGNOSIS — Z90722 Acquired absence of ovaries, bilateral: Secondary | ICD-10-CM | POA: Diagnosis not present

## 2016-05-08 DIAGNOSIS — Z91041 Radiographic dye allergy status: Secondary | ICD-10-CM | POA: Insufficient documentation

## 2016-05-08 DIAGNOSIS — Z8249 Family history of ischemic heart disease and other diseases of the circulatory system: Secondary | ICD-10-CM | POA: Diagnosis not present

## 2016-05-08 NOTE — Progress Notes (Signed)
Consult Note: Gyn-Onc  [name] 60 y.o. female  CC: No chief complaint on file.   HPI: Patient is seen today in consultation at the request of Dr. Simona Huh.  Patient is a very pleasant 60 year old gravida 3 para 3 with multiple medical problems including fibromyalgia and previous history of a borderline tumor of the ovary in 2006. Per her report she underwent a laparoscopic bilateral salpingo-oophorectomy and did not have any follow-up. She does report having hot flashes immediately thereafter. She took hormone replacement therapy for short time but has not been taking any now. The patient presented to her primary physician with a 5-6 week history of progressive lower abdominal discomfort.   She had a CT scan of the abdomen and pelvis performed on 04/04/2016 that revealed:  Reproductive: There is a large mixed cystic and solid mass in themidline of the pelvis and to the right of midline. The cysticcomponent the measures 12.9 x 9.2 cm. The adjacent solid componentcontains internal calcifications and measures approximately 4.7 x3.7 cm. It is unclear if this originates from the uterus or ovary.Neither normal ovary could be visualized.Other: No free fluid or free air.  IMPRESSION: Large predominately cystic mass within the midline of the pelviswith a solid component in the right pelvis. The solid componentcontains scattered calcifications. It is unclear if this is arisingfrom the uterus or ovary. Recommend further evaluation with pelvicultrasound. Left nephrolithiasis.  No ureteral stones or hydronephrosis.  Pelvic ultrasound was performed on July 31 that revealed: Dominant cystic mass with a small in the midline and to the left lying immediately adjacent to the uterus mural nodule in the midline and left adnexal region immediately adjacent to the uterus. Overall dimensions are 14.3 x 8.9 x 11.1 cm. The mass does not appear hypervascular.  IMPRESSION: 1. Large cystic mass with a mural nodule  in the midline and to the left closely applied to the uterus. Gynecological evaluation is recommended. 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 2. Small uterine fibroid.  The endometrium is unremarkable. 3. The ovaries are reportedly surgically absent.  She is referred to [place] today for evaluation of this. She was seen in the emergency room secondary to pain on August 3. At that time she was found to have a nonacute abdomen, she was advised to discontinue the tramadol and hydrocodone and was sent home with Dilaudid. She was recommended to follow-up with her primary care physician for evaluation and management. She was seen by Dr. Simona Huh on August 9. Tumor markers at that time revealed a CA-125 of 9.6, a CEA of 2.5. She has been having increasing constipation over the past several weeks. She's been taking her usual stool softener without significant relief. She has noticed increased pain since she had the ultrasound. She states that she always experiences chest pain or shortness of breath she "always" had this. She states is related to some tachycardia that she has for which she takes atenolol. She is followed by Dr. Earlie Counts her cardiologist at [facility]. She stay she had a cardiac catheterization 5 or 6 years ago was told that she did that "everything looked good". She never had a stent placed. She was last seen by her cardiologist in May 2017 and was told everything was "fine".  Her weight has been stable if not slightly up. She's been trying to lose weight. She denies any bleeding or discharge. 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 She surprised that they did not remove it when she had  her ovaries removed. She had her last mammogram at many years ago. She had her last colonoscopy in 2015, it was recommended that she have follow-up in 10 years. Her Pap smears are up-to-date. She did have an abnormal 1 many years ago. She had a repeat Pap smear which returned as normal.  She did  have a superficial thrombophlebitis in her legs after undergoing a laparoscopic cholecystectomy. She did aspirin for this. She also developed a phlebitis had an IV site in her right arm after she had her parathyroid gland removed.  Review of Systems: Constitutional: Denies fever. Skin: No rash Cardiovascular: always has chest pain and shortness of breath, no edema  Pulmonary: No cough Gastro Intestinal: Reporting intermittent lower abdominal soreness.  No nausea, vomiting, + constipation, no diarrhea reported. Genitourinary: No frequency, urgency, or dysuria.  Denies vaginal bleeding and discharge.  Musculoskeletal: No joint swelling or pain.   Current Meds:  Outpatient Encounter Prescriptions as of 05/08/2016  Medication Sig  . aspirin EC 81 MG tablet Take 81 mg by mouth daily.  Marland Kitchen atenolol (TENORMIN) 25 MG tablet 1 tablet daily and an extra 1/2 tablet as needed for palpitations (Patient taking differently: Take 25 mg by mouth daily. )  . Cholecalciferol ([employer]S VIT D 5000 HIGH-POTENCY) 5000 UNITS capsule Take 5,000 Units by mouth daily.  Marland Kitchen dexlansoprazole (DEXILANT) 60 MG capsule Take 60 mg by mouth daily.  . enalapril (VASOTEC) 5 MG tablet 1 tablet daily and an extra tablet if systolic BP is higher than 150 (Patient taking differently: Take 5 mg by mouth daily. 1 tablet daily and an extra tablet if systolic BP is higher than 150)  . EPINEPHrine (EPIPEN 2-PAK) 0.3 mg/0.3 mL SOAJ injection Inject 0.3 mg into the muscle daily as needed (allergic reaction).   Marland Kitchen escitalopram (LEXAPRO) 20 MG tablet Take 20 mg by mouth daily.   Marland Kitchen HYDROmorphone (DILAUDID) 2 MG tablet Take 0.5 tablets (1 mg total) by mouth every 6 (six) hours as needed for severe pain.  Marland Kitchen ibuprofen (ADVIL,MOTRIN) 200 MG tablet Take 400-800 mg by mouth every 8 (eight) hours as needed for mild pain or moderate pain.  . nitroGLYCERIN (NITROSTAT) 0.4 MG SL tablet Place 0.4 mg under the tongue every 5 (five) minutes as needed for chest pain.    No facility-administered encounter medications on file as of 05/08/2016.     Allergy:  Allergies  Allergen Reactions  . Contrast Media [Iodinated Diagnostic Agents] Anaphylaxis    IV dyes  . Iohexol Shortness Of Breath  . Bee Venom Swelling    Social Hx:   Social History   Social History  . Marital status: Married    Spouse name: N/A  . Number of children: N/A  . Years of education: N/A   Occupational History  . Not on file.   Social History Main Topics  . Smoking status: Former Smoker    Years: 25.00    Types: Cigarettes  . Smokeless tobacco: Never Used     Comment: "quit in smoking in ~ 2004"  . Alcohol use No  . Drug use: No  . Sexual activity: Yes   Other Topics Concern  . Not on file   Social History Narrative  . No narrative on file    Past Surgical Hx:  Past Surgical History:  Procedure Laterality Date  . BILATERAL SALPINGOOPHORECTOMY Bilateral   . BREAST LUMPECTOMY Right    "took out golf-ball sized growth"  . CARDIAC CATHETERIZATION  2007  . CARPAL TUNNEL  RELEASE Left   . CESAREAN SECTION  1976; 1977; 1980  . CHOLECYSTECTOMY  01/2006  . FRACTURE SURGERY    . NECK EXPLORATION  01/31/2015  . PARATHYROIDECTOMY  01/31/2015  . PARATHYROIDECTOMY N/A 01/31/2015   Procedure: NECK EXPLORATION WITH PARATHYROIDECTOMY;  Surgeon: Armandina Gemma, MD;  Location: [facility];  Service: General;  Laterality: N/A;  . TONSILLECTOMY    . TUBAL LIGATION  1980  . WRIST FRACTURE SURGERY Left 2014    Past Medical Hx:  Past Medical History:  Diagnosis Date  . Anemia    "when I was little"  . Anxiety   . Arrhythmia    cardiac dysrhythmia, tachycardia  . Arthritis    "lower back" (01/31/2015)  . Chest pain   . Complication of anesthesia    hard to wake up --  gallbladder here @ [facility]  . Depression   . DVT (deep venous thrombosis) ([place]) 01/2006   "legs; after my gallbladder OR"  . Fibromyalgia   . Gallstones   . GERD (gastroesophageal reflux disease)   . Headache     "weekly" (01/31/2015)  . Hx of echocardiogram    Echo (11/15):  Mild LVH, EF 55-60%, GLPSS mildly reduced (-16%), Gr 1 DD  . Hx of echocardiogram    Lexiscan Myoview (11/15):  Low risk; Fixed, small mild apical anterior and apical inferolateral perfusion defects. Suspect soft tissue attenuation given normal wall motion. No ischemia. EF 71%.  . Hyperglycemia   . Hyperlipidemia   . Hypertension   . Kidney anomaly, congenital    "falulty plumbing"  . Kidney stones   . Ovarian cancer ([place])    beginnings of ovarian cancer "both"  . Parathyroid abnormality ([place])   . Pneumonia X 2  . PVD (peripheral vascular disease) ([place])   . Restless leg syndrome     Oncology Hx:   No history exists.    Family Hx:  Family History  Problem Relation Age of Onset  . Hypertension Mother   . Hypothyroidism Mother   . Prostate cancer Father   . Heart attack Brother   . Heart attack Maternal Grandmother   . Heart attack Maternal Grandfather   . Heart attack Paternal Grandfather   . Stroke Neg Hx     Vitals:  There were no vitals taken for this visit.  Physical Exam: Well-nourished vulva female in no acute distress.  Neck: Supple, no lymphadenopathy, no thyromegaly. Well-healed surgical incision.  Lungs: Clear to auscultation but he.  Cardiac Astro: Regular rate and rhythm.  Abdomen: Obese, soft, tender in the lower quadrants. No rebound or guarding. No fluid wave. Exam is somewhat limited by habitus.  Groins: No lymphadenopathy.  Extremities: No edema.  Pelvic: Genitalia within normal limits. Vagina is atrophic. Cervix is without lesions, it is displaced behind the pubic symphysis. There is no visible lesions. Bimanual examination reveals an abdominal pelvic mass measuring approximately 15 cm. It is mobile. She is tender. Rectal examination reveals no nodularity.  Assessment/Plan: 60 year old with personal history of bilateral borderline tumors in 2006. This is per report and we do not  have any pathology to confirm this. She now has a large complex abdominopelvic mass in the setting of normal tumor markers. 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 I do believe we need to proceed with surgery. Her surgery will involve exploratory laparotomy hysterectomy and removal of the mass and appropriate staging. Even if the mass is not malignant I would consider postoperative anticoagulation secondary to  her history of a superficial thrombophlebitis in her legs after laparoscopic cholecystectomy. This was discussed with the patient and she is willing to proceed with postoperative Lovenox. She does recall that her CA-125 was elevated in 2006 that she does not recall exactly how high the number was.  Risks of surgery including but not limited to bleeding, infection, injury to surrounding organs, and thromboembolic disease were discussed with the patient. She was given written instructions will be seen in the preoperative clinic.  We will contact her cardiologist Dr. Earlie Counts in [facility] to obtain preoperative risk stratification secondary to the patient's history of tachycardia and complaints that she always has chest pain or shortness of breath.  She understands the surgery will be performed by one of my partners. Her surgery is tentatively scheduled for September 7 with Dr. Skeet Latch. However, this will of course be pending any evaluation of a be requested by her cardiologist.   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 I told her to anticipate 4-6 weeks out of work for this procedure.  Kainon Varady A., MD 05/08/2016, 12:45 PM

## 2016-05-08 NOTE — Telephone Encounter (Signed)
Called and left message for Dr. Rhetta Mura RN about the patient needing a letter or document for cardiac clearance to proceed with surgery next week.  The patient was last seen by Dr. Wyline Copas on 02/28/16.  His office is supposed to contact us with that information.

## 2016-05-08 NOTE — Patient Instructions (Addendum)
Preparing for your Surgery  Plan for surgery on May 16, 2016  with Dr. Janie Morning. Exploratory laparotomy , pelvic mass removal.   Pre-operative Testing -You will receive a phone call from presurgical testing at [facility] to arrange for a pre-operative testing appointment before your surgery.  This appointment normally occurs one to two weeks before your scheduled surgery.   -Bring your insurance card, copy of an advanced directive if applicable, medication list  -At that visit, you will be asked to sign a consent for a possible blood transfusion in case a transfusion becomes necessary during surgery.  The need for a blood transfusion is rare but having consent is a necessary part of your care.     -You should not be taking blood thinners or aspirin at least ten days prior to surgery unless instructed by your surgeon.  Day Before Surgery at [place] will be asked to take in a light diet the day before surgery.  Avoid carbonated beverages.  You will be advised to have nothing to eat or drink after midnight the evening before.     Eat a light diet the day before surgery.  Examples including soups, broths,  toast, yogurt, mashed potatoes.  Things to avoid include carbonated beverages  (fizzy beverages), raw fruits and raw vegetables, or beans.    If your bowels are filled with gas, your surgeon will have difficulty  visualizing your pelvic organs which increases your surgical risks.  Your role in recovery Your role is to become active as soon as directed by your doctor, while still giving yourself time to heal.  Rest when you feel tired. You will be asked to do the following in order to speed your recovery:  - Cough and breathe deeply. This helps toclear and expand your lungs and can prevent pneumonia. You may be given a spirometer to practice deep breathing. A staff member will show you how to use the spirometer. - Do mild physical activity. Walking or moving  your legs help your circulation and body functions return to normal. A staff member will help you when you try to walk and will provide you with simple exercises. Do not try to get up or walk alone the first time. - Actively manage your pain. Managing your pain lets you move in comfort. We will ask you to rate your pain on a scale of zero to 10. It is your responsibility to tell your doctor or nurse where and how much you hurt so your pain can be treated.  Special Considerations -If you are diabetic, you may be placed on insulin after surgery to have closer control over your blood sugars to promote healing and recovery.  This does not mean that you will be discharged on insulin.  If applicable, your oral antidiabetics will be resumed when you are tolerating a solid diet.  -Your final pathology results from surgery should be available by the Friday after surgery and the results will be relayed to you when available.   Blood Transfusion Information WHAT IS A BLOOD TRANSFUSION? A transfusion is the replacement of blood or some of its parts. Blood is made up of multiple cells which provide different functions.  Red blood cells carry oxygen and are used for blood loss replacement.  White blood cells fight against infection.  Platelets control bleeding.  Plasma helps clot blood.  Other blood products are available for specialized needs, such as hemophilia or other clotting disorders. BEFORE THE TRANSFUSION  Who gives  blood for transfusions?   You may be able to donate blood to be used at a later date on yourself (autologous donation).  Relatives can be asked to donate blood. This is generally not any safer than if you have received blood from a stranger. The same precautions are taken to ensure safety when a relative's blood is donated.  Healthy volunteers who are fully evaluated to make sure their blood is safe. This is blood bank blood. Transfusion therapy is the safest it has ever been in  the practice of medicine. Before blood is taken from a donor, a complete history is taken to make sure that person has no history of diseases nor engages in risky social behavior (examples are intravenous drug use or sexual activity with multiple partners). The donor's travel history is screened to minimize risk of transmitting infections, such as malaria. The donated blood is tested for signs of infectious diseases, such as HIV and hepatitis. The blood is then tested to be sure it is compatible with you in order to minimize the chance of a transfusion reaction. If you or a relative donates blood, this is often done in anticipation of surgery and is not appropriate for emergency situations. It takes many days to process the donated blood. RISKS AND COMPLICATIONS Although transfusion therapy is very safe and saves many lives, the main dangers of transfusion include:   Getting an infectious disease.  Developing a transfusion reaction. This is an allergic reaction to something in the blood you were given. Every precaution is taken to prevent this. The decision to have a blood transfusion has been considered carefully by your caregiver before blood is given. Blood is not given unless the benefits outweigh the risks.

## 2016-05-14 ENCOUNTER — Encounter (HOSPITAL_COMMUNITY)
Admission: RE | Admit: 2016-05-14 | Discharge: 2016-05-14 | Disposition: A | Discharge status: Home / Self Care | Payer: BC Managed Care – PPO | Source: Ambulatory Visit | Attending: Gynecologic Oncology | Admitting: Gynecologic Oncology

## 2016-05-14 ENCOUNTER — Encounter (HOSPITAL_COMMUNITY): Payer: Self-pay

## 2016-05-14 HISTORY — DX: Reserved for inherently not codable concepts without codable children: IMO0001

## 2016-05-14 LAB — COMPREHENSIVE METABOLIC PANEL
ALBUMIN: 4.3 g/dL (ref 3.5–5.0)
ALK PHOS: 85 U/L (ref 38–126)
ALT: 27 U/L (ref 14–54)
AST: 24 U/L (ref 15–41)
Anion gap: 6 (ref 5–15)
BILIRUBIN TOTAL: 0.7 mg/dL (ref 0.3–1.2)
BUN: 15 mg/dL (ref 6–20)
CO2: 29 mmol/L (ref 22–32)
Calcium: 10.9 mg/dL — ABNORMAL HIGH (ref 8.9–10.3)
Chloride: 106 mmol/L (ref 101–111)
Creatinine, Ser: 0.92 mg/dL (ref 0.44–1.00)
GFR calc Af Amer: >60 mL/min (ref >60)
GFR calc non Af Amer: >60 mL/min (ref >60)
GLUCOSE: 98 mg/dL (ref 65–99)
POTASSIUM: 4.3 mmol/L (ref 3.5–5.1)
Sodium: 141 mmol/L (ref 135–145)
TOTAL PROTEIN: 7 g/dL (ref 6.5–8.1)

## 2016-05-14 LAB — CBC WITH DIFFERENTIAL/PLATELET
BASOS ABS: 0.1 10*3/uL (ref 0.0–0.1)
BASOS PCT: 1 %
Eosinophils Absolute: 0.1 10*3/uL (ref 0.0–0.7)
Eosinophils Relative: 2 %
HEMATOCRIT: 42.5 % (ref 36.0–46.0)
Hemoglobin: 14.5 g/dL (ref 12.0–15.0)
Lymphocytes Relative: 48 %
Lymphs Abs: 2.4 10*3/uL (ref 0.7–4.0)
MCH: 30.9 pg (ref 26.0–34.0)
MCHC: 34.1 g/dL (ref 30.0–36.0)
MCV: 90.4 fL (ref 78.0–100.0)
Monocytes Absolute: 0.5 10*3/uL (ref 0.1–1.0)
Monocytes Relative: 9 %
NEUTROS ABS: 2 10*3/uL (ref 1.7–7.7)
NEUTROS PCT: 40 %
Platelets: 193 10*3/uL (ref 150–400)
RBC: 4.7 MIL/uL (ref 3.87–5.11)
RDW: 11.7 % (ref 11.5–15.5)
WBC: 5.1 10*3/uL (ref 4.0–10.5)

## 2016-05-14 LAB — URINE MICROSCOPIC-ADD ON

## 2016-05-14 LAB — URINALYSIS, ROUTINE W REFLEX MICROSCOPIC
Bilirubin Urine: NEGATIVE
GLUCOSE, UA: NEGATIVE mg/dL
HGB URINE DIPSTICK: NEGATIVE
Ketones, ur: NEGATIVE mg/dL
Nitrite: NEGATIVE
PH: 6 (ref 5.0–8.0)
PROTEIN: NEGATIVE mg/dL
Specific Gravity, Urine: 1.018 (ref 1.005–1.030)

## 2016-05-14 LAB — ABO/RH: ABO/RH(D): A POS

## 2016-05-14 NOTE — Pre-Procedure Instructions (Signed)
I called Dr. Leone Brand office and spoke with Shirlean Mylar to let her know we have not received cardiac clearance from Dr. Wyline Copas in [facility], [place], and also that Dr. Lissa Hoard has concerns about pt's most recent EKG. Shirlean Mylar called back and told Shameeka, PST scheduler that their office has received cardiac clearance for the patient.

## 2016-05-14 NOTE — Patient Instructions (Signed)
PORCIA BALDONADO  05/14/2016   Your procedure is scheduled on: 05/16/16  Report to [facility] Main  Entrance take [facility]  elevators to 3rd floor to  [place] at 6:15 AM.  Call this number if you have problems the morning of surgery [phone]   Remember: ONLY 1 PERSON MAY GO WITH YOU TO SHORT STAY TO GET  READY MORNING OF [place].  Do not eat food or drink liquids :After Midnight.     Take these medicines the morning of surgery with A SIP OF WATER: Atenolol, Dexilant, Lexapro                                You may not have any metal on your body including hair pins and              piercings  Do not wear jewelry, make-up, lotions, powders or perfumes, deodorant             Do not wear nail polish.  Do not shave  48 hours prior to surgery.                 Do not bring valuables to the hospital. [place].  Contacts, dentures or bridgework may not be worn into surgery.  Leave suitcase in the car. After surgery it may be brought to your room.     _____________________________________________________________________             [facility] - Preparing for Surgery Before surgery, you can play an important role.  Because skin is not sterile, your skin needs to be as free of germs as possible.  You can reduce the number of germs on your skin by washing with CHG (chlorahexidine gluconate) soap before surgery.  CHG is an antiseptic cleaner which kills germs and bonds with the skin to continue killing germs even after washing. Please DO NOT use if you have an allergy to CHG or antibacterial soaps.  If your skin becomes reddened/irritated stop using the CHG and inform your nurse when you arrive at Short Stay. Do not shave (including legs and underarms) for at least 48 hours prior to the first CHG shower.  You may shave your face/neck. Please follow these instructions carefully:  1.  Shower with CHG Soap the  night before surgery and the  morning of Surgery.  2.  If you choose to wash your hair, wash your hair first as usual with your  normal  shampoo.  3.  After you shampoo, rinse your hair and body thoroughly to remove the  shampoo.                           4.  Use CHG as you would any other liquid soap.  You can apply chg directly  to the skin and wash                       Gently with a scrungie or clean washcloth.  5.  Apply the CHG Soap to your body ONLY FROM THE NECK DOWN.   Do not use on face/ open  Wound or open sores. Avoid contact with eyes, ears mouth and genitals (private parts).                       Wash face,  Genitals (private parts) with your normal soap.             6.  Wash thoroughly, paying special attention to the area where your surgery  will be performed.  7.  Thoroughly rinse your body with warm water from the neck down.  8.  DO NOT shower/wash with your normal soap after using and rinsing off  the CHG Soap.                9.  Pat yourself dry with a clean towel.            10.  Wear clean pajamas.            11.  Place clean sheets on your bed the night of your first shower and do not  sleep with pets. Day of Surgery : Do not apply any lotions/deodorants the morning of surgery.  Please wear clean clothes to the hospital/surgery center.  FAILURE TO FOLLOW THESE INSTRUCTIONS MAY RESULT IN THE CANCELLATION OF YOUR SURGERY PATIENT SIGNATURE_________________________________  NURSE SIGNATURE__________________________________  ________________________________________________________________________

## 2016-05-14 NOTE — Pre-Procedure Instructions (Signed)
UA result routed to Dr. Skeet Latch

## 2016-05-15 ENCOUNTER — Telehealth: Payer: Self-pay | Admitting: Gynecologic Oncology

## 2016-05-15 LAB — URINE CULTURE: SPECIAL REQUESTS: NORMAL

## 2016-05-15 NOTE — Telephone Encounter (Signed)
Called and spoke with patient.  She stated she went yesterday to her cardiologist and they were not concerned about her recent EKG and they stated they had faxed cardiac clearance last week.  Advised patient that we had forwarded that fax to pre-surgical last week and refaxed again this am.  Patient advised to call for any needs.

## 2016-05-15 NOTE — Progress Notes (Signed)
Cardiac clearance note dr Wyline Copas on chart for 05-16-16 surgery

## 2016-05-16 ENCOUNTER — Encounter (HOSPITAL_COMMUNITY): Payer: Self-pay | Admitting: *Deleted

## 2016-05-16 ENCOUNTER — Inpatient Hospital Stay (HOSPITAL_COMMUNITY): Payer: BC Managed Care – PPO | Admitting: Anesthesiology

## 2016-05-16 ENCOUNTER — Encounter (HOSPITAL_COMMUNITY)
Admission: RE | Disposition: A | Discharge status: Home / Self Care | Payer: Self-pay | Source: Ambulatory Visit | Attending: Obstetrics & Gynecology

## 2016-05-16 ENCOUNTER — Inpatient Hospital Stay (HOSPITAL_COMMUNITY)
Admission: RE | Admit: 2016-05-16 | Discharge: 2016-05-18 | DRG: 358 | Disposition: A | Discharge status: Home / Self Care | Payer: BC Managed Care – PPO | Source: Ambulatory Visit | Attending: Obstetrics & Gynecology | Admitting: Obstetrics & Gynecology

## 2016-05-16 DIAGNOSIS — R079 Chest pain, unspecified: Secondary | ICD-10-CM | POA: Diagnosis present

## 2016-05-16 DIAGNOSIS — Z87891 Personal history of nicotine dependence: Secondary | ICD-10-CM | POA: Diagnosis not present

## 2016-05-16 DIAGNOSIS — R000 Tachycardia, unspecified: Secondary | ICD-10-CM | POA: Diagnosis present

## 2016-05-16 DIAGNOSIS — K219 Gastro-esophageal reflux disease without esophagitis: Secondary | ICD-10-CM | POA: Diagnosis present

## 2016-05-16 DIAGNOSIS — F329 Major depressive disorder, single episode, unspecified: Secondary | ICD-10-CM | POA: Diagnosis present

## 2016-05-16 DIAGNOSIS — Z87442 Personal history of urinary calculi: Secondary | ICD-10-CM | POA: Diagnosis not present

## 2016-05-16 DIAGNOSIS — I739 Peripheral vascular disease, unspecified: Secondary | ICD-10-CM | POA: Diagnosis present

## 2016-05-16 DIAGNOSIS — R1900 Intra-abdominal and pelvic swelling, mass and lump, unspecified site: Secondary | ICD-10-CM | POA: Diagnosis present

## 2016-05-16 DIAGNOSIS — Z79899 Other long term (current) drug therapy: Secondary | ICD-10-CM | POA: Diagnosis not present

## 2016-05-16 DIAGNOSIS — Z91041 Radiographic dye allergy status: Secondary | ICD-10-CM

## 2016-05-16 DIAGNOSIS — Z91030 Bee allergy status: Secondary | ICD-10-CM

## 2016-05-16 DIAGNOSIS — E785 Hyperlipidemia, unspecified: Secondary | ICD-10-CM | POA: Diagnosis present

## 2016-05-16 DIAGNOSIS — K668 Other specified disorders of peritoneum: Secondary | ICD-10-CM | POA: Diagnosis present

## 2016-05-16 DIAGNOSIS — Z7982 Long term (current) use of aspirin: Secondary | ICD-10-CM | POA: Diagnosis not present

## 2016-05-16 DIAGNOSIS — F419 Anxiety disorder, unspecified: Secondary | ICD-10-CM | POA: Diagnosis present

## 2016-05-16 DIAGNOSIS — D287 Benign neoplasm of other specified female genital organs: Secondary | ICD-10-CM | POA: Diagnosis not present

## 2016-05-16 DIAGNOSIS — I10 Essential (primary) hypertension: Secondary | ICD-10-CM | POA: Diagnosis present

## 2016-05-16 DIAGNOSIS — Z8543 Personal history of malignant neoplasm of ovary: Secondary | ICD-10-CM

## 2016-05-16 DIAGNOSIS — G2581 Restless legs syndrome: Secondary | ICD-10-CM | POA: Diagnosis present

## 2016-05-16 DIAGNOSIS — Z8672 Personal history of thrombophlebitis: Secondary | ICD-10-CM

## 2016-05-16 DIAGNOSIS — Z8249 Family history of ischemic heart disease and other diseases of the circulatory system: Secondary | ICD-10-CM

## 2016-05-16 DIAGNOSIS — K5900 Constipation, unspecified: Secondary | ICD-10-CM | POA: Diagnosis present

## 2016-05-16 DIAGNOSIS — M797 Fibromyalgia: Secondary | ICD-10-CM | POA: Diagnosis present

## 2016-05-16 HISTORY — PX: LAPAROTOMY: SHX154

## 2016-05-16 HISTORY — PX: ABDOMINAL HYSTERECTOMY: SHX81

## 2016-05-16 LAB — TYPE AND SCREEN
ABO/RH(D): A POS
Antibody Screen: NEGATIVE

## 2016-05-16 SURGERY — LAPAROTOMY, EXPLORATORY
Anesthesia: General

## 2016-05-16 MED ORDER — ONDANSETRON HCL 4 MG/2ML IJ SOLN: 4.0000 mg | Freq: Four times a day (QID) | INTRAMUSCULAR | Status: DC | PRN

## 2016-05-16 MED ORDER — ONDANSETRON HCL 4 MG/2ML IJ SOLN
INTRAMUSCULAR | Status: DC | PRN
  Administered 2016-05-16: 4 mg via INTRAVENOUS

## 2016-05-16 MED ORDER — KCL IN DEXTROSE-NACL 20-5-0.45 MEQ/L-%-% IV SOLN
INTRAVENOUS | Status: DC
  Administered 2016-05-16 – 2016-05-17 (×2): via INTRAVENOUS
  Filled 2016-05-16 (×2): qty 1000

## 2016-05-16 MED ORDER — GABAPENTIN 300 MG PO CAPS
600.0000 mg | ORAL_CAPSULE | Freq: Every day | ORAL | Status: AC
  Administered 2016-05-16: 600 mg via ORAL
  Filled 2016-05-16: qty 2

## 2016-05-16 MED ORDER — ENOXAPARIN SODIUM 40 MG/0.4ML ~~LOC~~ SOLN
40.0000 mg | SUBCUTANEOUS | Status: DC
  Administered 2016-05-17 – 2016-05-18 (×2): 40 mg via SUBCUTANEOUS
  Filled 2016-05-16 (×2): qty 0.4

## 2016-05-16 MED ORDER — NITROGLYCERIN 0.4 MG SL SUBL: 0.4000 mg | SUBLINGUAL_TABLET | SUBLINGUAL | Status: DC | PRN

## 2016-05-16 MED ORDER — BUPIVACAINE LIPOSOME 1.3 % IJ SUSP
INTRAMUSCULAR | Status: DC | PRN
  Administered 2016-05-16: 20 mL

## 2016-05-16 MED ORDER — MIDAZOLAM HCL 2 MG/2ML IJ SOLN
INTRAMUSCULAR | Status: AC
  Filled 2016-05-16: qty 2

## 2016-05-16 MED ORDER — BUPIVACAINE LIPOSOME 1.3 % IJ SUSP
20.0000 mL | Freq: Once | INTRAMUSCULAR | Status: DC
  Filled 2016-05-16: qty 20

## 2016-05-16 MED ORDER — FENTANYL CITRATE (PF) 250 MCG/5ML IJ SOLN
INTRAMUSCULAR | Status: DC | PRN
  Administered 2016-05-16: 150 ug via INTRAVENOUS
  Administered 2016-05-16: 50 ug via INTRAVENOUS

## 2016-05-16 MED ORDER — SUGAMMADEX SODIUM 200 MG/2ML IV SOLN
INTRAVENOUS | Status: DC | PRN
  Administered 2016-05-16: 300 mg via INTRAVENOUS

## 2016-05-16 MED ORDER — PROMETHAZINE HCL 25 MG/ML IJ SOLN: 6.2500 mg | INTRAMUSCULAR | Status: DC | PRN

## 2016-05-16 MED ORDER — LIDOCAINE 2% (20 MG/ML) 5 ML SYRINGE
INTRAMUSCULAR | Status: AC
  Filled 2016-05-16: qty 5

## 2016-05-16 MED ORDER — SODIUM CHLORIDE 0.9 % IJ SOLN
INTRAMUSCULAR | Status: AC
  Filled 2016-05-16: qty 200

## 2016-05-16 MED ORDER — HYDROMORPHONE HCL 1 MG/ML IJ SOLN: 0.2500 mg | INTRAMUSCULAR | Status: DC | PRN

## 2016-05-16 MED ORDER — GLYCOPYRROLATE 0.2 MG/ML IJ SOLN
INTRAMUSCULAR | Status: DC | PRN
  Administered 2016-05-16: 0.2 mg via INTRAVENOUS

## 2016-05-16 MED ORDER — SUCCINYLCHOLINE CHLORIDE 20 MG/ML IJ SOLN
INTRAMUSCULAR | Status: DC | PRN
  Administered 2016-05-16: 100 mg via INTRAVENOUS

## 2016-05-16 MED ORDER — PROPOFOL 10 MG/ML IV BOLUS
INTRAVENOUS | Status: AC
  Filled 2016-05-16: qty 40

## 2016-05-16 MED ORDER — DEXAMETHASONE SODIUM PHOSPHATE 10 MG/ML IJ SOLN
INTRAMUSCULAR | Status: AC
  Filled 2016-05-16: qty 1

## 2016-05-16 MED ORDER — FENTANYL CITRATE (PF) 100 MCG/2ML IJ SOLN
INTRAMUSCULAR | Status: AC
  Filled 2016-05-16: qty 4

## 2016-05-16 MED ORDER — HYDROMORPHONE HCL 2 MG/ML IJ SOLN
INTRAMUSCULAR | Status: AC
  Filled 2016-05-16: qty 1

## 2016-05-16 MED ORDER — ENSURE ENLIVE PO LIQD
237.0000 mL | Freq: Two times a day (BID) | ORAL | Status: DC
  Administered 2016-05-16 – 2016-05-17 (×3): 237 mL via ORAL

## 2016-05-16 MED ORDER — EPHEDRINE 5 MG/ML INJ
INTRAVENOUS | Status: AC
  Filled 2016-05-16: qty 10

## 2016-05-16 MED ORDER — EPHEDRINE SULFATE 50 MG/ML IJ SOLN
INTRAMUSCULAR | Status: DC | PRN
  Administered 2016-05-16: 10 mg via INTRAVENOUS

## 2016-05-16 MED ORDER — ASPIRIN EC 81 MG PO TBEC
81.0000 mg | DELAYED_RELEASE_TABLET | Freq: Every day | ORAL | Status: DC
  Administered 2016-05-17 – 2016-05-18 (×2): 81 mg via ORAL
  Filled 2016-05-16 (×2): qty 1

## 2016-05-16 MED ORDER — SODIUM CHLORIDE 0.9 % IJ SOLN
INTRAMUSCULAR | Status: DC | PRN
  Administered 2016-05-16: 150 mL

## 2016-05-16 MED ORDER — ONDANSETRON HCL 4 MG/2ML IJ SOLN
INTRAMUSCULAR | Status: AC
  Filled 2016-05-16: qty 2

## 2016-05-16 MED ORDER — KETOROLAC TROMETHAMINE 30 MG/ML IJ SOLN
INTRAMUSCULAR | Status: AC
  Filled 2016-05-16: qty 1

## 2016-05-16 MED ORDER — HEPARIN SODIUM (PORCINE) 5000 UNIT/ML IJ SOLN
5000.0000 [IU] | INTRAMUSCULAR | Status: AC
  Administered 2016-05-16: 5000 [IU] via SUBCUTANEOUS
  Filled 2016-05-16: qty 1

## 2016-05-16 MED ORDER — ONDANSETRON HCL 4 MG PO TABS: 4.0000 mg | ORAL_TABLET | Freq: Four times a day (QID) | ORAL | Status: DC | PRN

## 2016-05-16 MED ORDER — IBUPROFEN 800 MG PO TABS
800.0000 mg | ORAL_TABLET | Freq: Four times a day (QID) | ORAL | Status: DC
  Administered 2016-05-17 – 2016-05-18 (×5): 800 mg via ORAL
  Filled 2016-05-16 (×5): qty 1

## 2016-05-16 MED ORDER — HYDROMORPHONE HCL 1 MG/ML IJ SOLN
INTRAMUSCULAR | Status: DC | PRN
  Administered 2016-05-16 (×2): 1 mg via INTRAVENOUS

## 2016-05-16 MED ORDER — LIDOCAINE HCL (CARDIAC) 20 MG/ML IV SOLN
INTRAVENOUS | Status: DC | PRN
  Administered 2016-05-16: 50 mg via INTRAVENOUS

## 2016-05-16 MED ORDER — ENALAPRIL MALEATE 5 MG PO TABS
5.0000 mg | ORAL_TABLET | Freq: Every day | ORAL | Status: DC
  Administered 2016-05-17 – 2016-05-18 (×2): 5 mg via ORAL
  Filled 2016-05-16 (×2): qty 1

## 2016-05-16 MED ORDER — ESCITALOPRAM OXALATE 20 MG PO TABS
20.0000 mg | ORAL_TABLET | Freq: Every day | ORAL | Status: DC
  Administered 2016-05-17 – 2016-05-18 (×2): 20 mg via ORAL
  Filled 2016-05-16 (×2): qty 1

## 2016-05-16 MED ORDER — BUPIVACAINE HCL (PF) 0.25 % IJ SOLN
INTRAMUSCULAR | Status: DC | PRN
  Administered 2016-05-16: 30 mL

## 2016-05-16 MED ORDER — CEFAZOLIN SODIUM-DEXTROSE 2-4 GM/100ML-% IV SOLN
2.0000 g | INTRAVENOUS | Status: AC
  Administered 2016-05-16: 2 g via INTRAVENOUS

## 2016-05-16 MED ORDER — DEXAMETHASONE SODIUM PHOSPHATE 10 MG/ML IJ SOLN
INTRAMUSCULAR | Status: DC | PRN
  Administered 2016-05-16: 10 mg via INTRAVENOUS

## 2016-05-16 MED ORDER — CEFAZOLIN SODIUM-DEXTROSE 2-4 GM/100ML-% IV SOLN
INTRAVENOUS | Status: AC
Start: 2016-05-16 — End: 2016-05-16
  Filled 2016-05-16: qty 100

## 2016-05-16 MED ORDER — LACTATED RINGERS IV SOLN
INTRAVENOUS | Status: DC | PRN
  Administered 2016-05-16 (×3): via INTRAVENOUS

## 2016-05-16 MED ORDER — ACETAMINOPHEN 500 MG PO TABS
1000.0000 mg | ORAL_TABLET | Freq: Four times a day (QID) | ORAL | Status: DC
  Administered 2016-05-16 – 2016-05-18 (×8): 1000 mg via ORAL
  Filled 2016-05-16 (×8): qty 2

## 2016-05-16 MED ORDER — ATENOLOL 50 MG PO TABS
25.0000 mg | ORAL_TABLET | Freq: Every day | ORAL | Status: DC
  Administered 2016-05-17 – 2016-05-18 (×2): 25 mg via ORAL
  Filled 2016-05-16 (×2): qty 1

## 2016-05-16 MED ORDER — MIDAZOLAM HCL 5 MG/5ML IJ SOLN
INTRAMUSCULAR | Status: DC | PRN
  Administered 2016-05-16: 2 mg via INTRAVENOUS

## 2016-05-16 MED ORDER — TRAMADOL HCL 50 MG PO TABS
100.0000 mg | ORAL_TABLET | Freq: Four times a day (QID) | ORAL | Status: DC
  Administered 2016-05-16 – 2016-05-18 (×8): 100 mg via ORAL
  Filled 2016-05-16 (×9): qty 2

## 2016-05-16 MED ORDER — BUPIVACAINE HCL (PF) 0.25 % IJ SOLN
INTRAMUSCULAR | Status: AC
  Filled 2016-05-16: qty 30

## 2016-05-16 MED ORDER — SUGAMMADEX SODIUM 200 MG/2ML IV SOLN
INTRAVENOUS | Status: AC
Start: 2016-05-16 — End: 2016-05-16
  Filled 2016-05-16: qty 4

## 2016-05-16 MED ORDER — ROCURONIUM BROMIDE 100 MG/10ML IV SOLN
INTRAVENOUS | Status: DC | PRN
  Administered 2016-05-16: 40 mg via INTRAVENOUS

## 2016-05-16 MED ORDER — MAGNESIUM HYDROXIDE 400 MG/5ML PO SUSP
30.0000 mL | Freq: Three times a day (TID) | ORAL | Status: AC
  Administered 2016-05-16 – 2016-05-17 (×2): 30 mL via ORAL
  Filled 2016-05-16 (×2): qty 30

## 2016-05-16 MED ORDER — OXYCODONE HCL 5 MG PO TABS
5.0000 mg | ORAL_TABLET | ORAL | Status: DC | PRN
  Administered 2016-05-16: 5 mg via ORAL
  Filled 2016-05-16: qty 1

## 2016-05-16 MED ORDER — HYDROMORPHONE HCL 1 MG/ML IJ SOLN
0.5000 mg | INTRAMUSCULAR | Status: DC | PRN
  Administered 2016-05-16: 0.5 mg via INTRAVENOUS
  Filled 2016-05-16: qty 1

## 2016-05-16 MED ORDER — PROPOFOL 10 MG/ML IV BOLUS
INTRAVENOUS | Status: DC | PRN
  Administered 2016-05-16: 150 mg via INTRAVENOUS

## 2016-05-16 MED ORDER — HEPARIN SODIUM (PORCINE) 1000 UNIT/ML IJ SOLN
INTRAMUSCULAR | Status: AC
  Filled 2016-05-16: qty 1

## 2016-05-16 MED ORDER — ROCURONIUM BROMIDE 10 MG/ML (PF) SYRINGE
PREFILLED_SYRINGE | INTRAVENOUS | Status: AC
  Filled 2016-05-16: qty 10

## 2016-05-16 SURGICAL SUPPLY — 57 items
APL SKNCLS STERI-STRIP NONHPOA (GAUZE/BANDAGES/DRESSINGS) ×1
APPLIER CLIP 11 MED OPEN (CLIP)
APPLIER CLIP 13 LRG OPEN (CLIP)
APR CLP LRG 13 20 CLIP (CLIP)
APR CLP MED 11 20 MLT OPN (CLIP)
ATTRACTOMAT 16X20 MAGNETIC DRP (DRAPES) ×3 IMPLANT
BENZOIN TINCTURE PRP APPL 2/3 (GAUZE/BANDAGES/DRESSINGS) ×3 IMPLANT
BLADE EXTENDED COATED 6.5IN (ELECTRODE) ×3 IMPLANT
CHLORAPREP W/TINT 26ML (MISCELLANEOUS) ×3 IMPLANT
CLIP APPLIE 11 MED OPEN (CLIP) IMPLANT
CLIP APPLIE 13 LRG OPEN (CLIP) IMPLANT
CLOSURE WOUND 1/2 X4 (GAUZE/BANDAGES/DRESSINGS) ×1
CONT SPEC 4OZ CLIKSEAL STRL BL (MISCELLANEOUS) ×3 IMPLANT
COVER SURGICAL LIGHT HANDLE (MISCELLANEOUS) ×3 IMPLANT
DRAPE UTILITY XL STRL (DRAPES) IMPLANT
DRAPE WARM FLUID 44X44 (DRAPE) ×3 IMPLANT
DRSG OPSITE POSTOP 4X8 (GAUZE/BANDAGES/DRESSINGS) ×3 IMPLANT
ELECT REM PT RETURN 9FT ADLT (ELECTROSURGICAL) ×3
ELECTRODE REM PT RTRN 9FT ADLT (ELECTROSURGICAL) ×1 IMPLANT
GAUZE SPONGE 4X4 12PLY STRL (GAUZE/BANDAGES/DRESSINGS) IMPLANT
GAUZE SPONGE 4X4 16PLY XRAY LF (GAUZE/BANDAGES/DRESSINGS) IMPLANT
GLOVE BIO SURGEON STRL SZ 6.5 (GLOVE) ×2 IMPLANT
GLOVE BIO SURGEON STRL SZ7.5 (GLOVE) ×6 IMPLANT
GLOVE BIO SURGEONS STRL SZ 6.5 (GLOVE) ×1
GLOVE INDICATOR 8.0 STRL GRN (GLOVE) ×3 IMPLANT
GOWN STRL REUS W/TWL XL LVL3 (GOWN DISPOSABLE) ×3 IMPLANT
KIT BASIN OR (CUSTOM PROCEDURE TRAY) ×3 IMPLANT
LIGASURE IMPACT 36 18CM CVD LR (INSTRUMENTS) ×3 IMPLANT
NEEDLE HYPO 22GX1.5 SAFETY (NEEDLE) ×6 IMPLANT
NEEDLE SPNL 22GX3.5 QUINCKE BK (NEEDLE) ×6 IMPLANT
NS IRRIG 1000ML POUR BTL (IV SOLUTION) IMPLANT
PACK GENERAL/GYN (CUSTOM PROCEDURE TRAY) ×3 IMPLANT
RETRACTOR WND ALEXIS 25 LRG (MISCELLANEOUS) ×1 IMPLANT
RTRCTR WOUND ALEXIS 25CM LRG (MISCELLANEOUS) ×3
SHEET LAVH (DRAPES) ×3 IMPLANT
SPONGE LAP 18X18 X RAY DECT (DISPOSABLE) ×6 IMPLANT
STRIP CLOSURE SKIN 1/2X4 (GAUZE/BANDAGES/DRESSINGS) ×2 IMPLANT
SUT ETHILON 1 LR 30 (SUTURE) IMPLANT
SUT MNCRL AB 4-0 PS2 18 (SUTURE) ×6 IMPLANT
SUT PDS AB 0 CT1 36 (SUTURE) ×6 IMPLANT
SUT PDS AB 0 CTX 60 (SUTURE) ×6 IMPLANT
SUT SILK 2 0 (SUTURE)
SUT SILK 2 0 30  PSL (SUTURE)
SUT SILK 2 0 30 PSL (SUTURE) IMPLANT
SUT SILK 2-0 18XBRD TIE 12 (SUTURE) IMPLANT
SUT VIC AB 0 CT1 36 (SUTURE) ×6 IMPLANT
SUT VIC AB 2-0 SH 27 (SUTURE) ×6
SUT VIC AB 2-0 SH 27X BRD (SUTURE) ×2 IMPLANT
SUT VIC AB 3-0 CTX 36 (SUTURE) IMPLANT
SUT VICRYL 0 TIES 12 18 (SUTURE) ×3 IMPLANT
SYR 20CC LL (SYRINGE) ×6 IMPLANT
TOWEL BLUE STERILE X RAY DET (MISCELLANEOUS) IMPLANT
TOWEL OR 17X26 10 PK STRL BLUE (TOWEL DISPOSABLE) ×3 IMPLANT
TOWEL OR NON WOVEN STRL DISP B (DISPOSABLE) ×3 IMPLANT
TRAY FOLEY W/METER SILVER 14FR (SET/KITS/TRAYS/PACK) ×3 IMPLANT
TRAY FOLEY W/METER SILVER 16FR (SET/KITS/TRAYS/PACK) ×3 IMPLANT
WATER STERILE IRR 1500ML POUR (IV SOLUTION) IMPLANT

## 2016-05-16 NOTE — Anesthesia Preprocedure Evaluation (Addendum)
Anesthesia Evaluation  Patient identified by MRN, date of birth, ID band Patient awake    Reviewed: Allergy & Precautions, NPO status , Patient's Chart, lab work & pertinent test results  History of Anesthesia Complications (+) PROLONGED EMERGENCE and history of anesthetic complications  Airway Mallampati: II  TM Distance: >3 FB Neck ROM: Full    Dental no notable dental hx.    Pulmonary shortness of breath, pneumonia, resolved, former smoker,    Pulmonary exam normal breath sounds clear to auscultation       Cardiovascular hypertension, Pt. on medications and Pt. on home beta blockers + Peripheral Vascular Disease  Normal cardiovascular exam+ dysrhythmias  Rhythm:Regular Rate:Normal  ECHO 07/20/14: Study Conclusions  - Left ventricle: The cavity size was normal. Wall thickness was increased in a pattern of mild LVH. Systolic function was normal. The estimated ejection fraction was in the range of 55% to 60%. No gross wall motion abnormalities, however, there is mildly abnormal basal inferior/inferoseptal strain - overall GLPSS is mildly reduced at -16%. This could represent an early ischemic finding. Doppler parameters are consistent with abnormal left ventricular relaxation (grade 1 diastolic dysfunction). The E/e&' ratio is between 8-15, suggesting indeterminate LV filling pressure. - Mitral valve: Structurally normal valve. There was trivial regurgitation. - Left atrium: The atrium was normal in size.   Neuro/Psych  Headaches, PSYCHIATRIC DISORDERS Anxiety Depression  Neuromuscular disease    GI/Hepatic Neg liver ROS, GERD  Medicated,  Endo/Other  negative endocrine ROS  Renal/GU Renal disease  negative genitourinary   Musculoskeletal  (+) Arthritis , Fibromyalgia -  Abdominal   Peds negative pediatric ROS (+)  Hematology  (+) anemia ,   Anesthesia Other Findings    Reproductive/Obstetrics negative OB ROS                           Anesthesia Physical Anesthesia Plan  ASA: II  Anesthesia Plan: General   Post-op Pain Management:    Induction: Intravenous  Airway Management Planned: Oral ETT  Additional Equipment:   Intra-op Plan:   Post-operative Plan: Extubation in OR  Informed Consent: I have reviewed the patients History and Physical, chart, labs and discussed the procedure including the risks, benefits and alternatives for the proposed anesthesia with the patient or authorized representative who has indicated his/her understanding and acceptance.   Dental advisory given  Plan Discussed with: CRNA  Anesthesia Plan Comments: (Ms. Clure requested that I take a picture of her mass.  I will use EPIC haiku with patient's consent and Dr. Leone Brand knowledge and consent.)      Anesthesia Quick Evaluation

## 2016-05-16 NOTE — Progress Notes (Signed)
Patient is admitted to [id number]. Admission Vital sign is stable. Patient os AOX4 with a pain level at 8

## 2016-05-16 NOTE — H&P (View-Only) (Signed)
Consult Note: Gyn-Onc  [name] 60 y.o. female  CC: No chief complaint on file.   HPI: Patient is seen today in consultation at the request of Dr. Simona Huh.  Patient is a very pleasant 60 year old gravida 3 para 3 with multiple medical problems including fibromyalgia and previous history of a borderline tumor of the ovary in 2006. Per her report she underwent a laparoscopic bilateral salpingo-oophorectomy and did not have any follow-up. She does report having hot flashes immediately thereafter. She took hormone replacement therapy for short time but has not been taking any now. The patient presented to her primary physician with a 5-6 week history of progressive lower abdominal discomfort.   She had a CT scan of the abdomen and pelvis performed on 04/04/2016 that revealed:  Reproductive: There is a large mixed cystic and solid mass in themidline of the pelvis and to the right of midline. The cysticcomponent the measures 12.9 x 9.2 cm. The adjacent solid componentcontains internal calcifications and measures approximately 4.7 x3.7 cm. It is unclear if this originates from the uterus or ovary.Neither normal ovary could be visualized.Other: No free fluid or free air.  IMPRESSION: Large predominately cystic mass within the midline of the pelviswith a solid component in the right pelvis. The solid componentcontains scattered calcifications. It is unclear if this is arisingfrom the uterus or ovary. Recommend further evaluation with pelvicultrasound. Left nephrolithiasis.  No ureteral stones or hydronephrosis.  Pelvic ultrasound was performed on July 31 that revealed: Dominant cystic mass with a small in the midline and to the left lying immediately adjacent to the uterus mural nodule in the midline and left adnexal region immediately adjacent to the uterus. Overall dimensions are 14.3 x 8.9 x 11.1 cm. The mass does not appear hypervascular.  IMPRESSION: 1. Large cystic mass with a mural nodule  in the midline and to the left closely applied to the uterus. Gynecological evaluation is recommended. 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 2. Small uterine fibroid.  The endometrium is unremarkable. 3. The ovaries are reportedly surgically absent.  She is referred to [place] today for evaluation of this. She was seen in the emergency room secondary to pain on August 3. At that time she was found to have a nonacute abdomen, she was advised to discontinue the tramadol and hydrocodone and was sent home with Dilaudid. She was recommended to follow-up with her primary care physician for evaluation and management. She was seen by Dr. Simona Huh on August 9. Tumor markers at that time revealed a CA-125 of 9.6, a CEA of 2.5. She has been having increasing constipation over the past several weeks. She's been taking her usual stool softener without significant relief. She has noticed increased pain since she had the ultrasound. She states that she always experiences chest pain or shortness of breath she "always" had this. She states is related to some tachycardia that she has for which she takes atenolol. She is followed by Dr. Earlie Counts her cardiologist at [facility]. She stay she had a cardiac catheterization 5 or 6 years ago was told that she did that "everything looked good". She never had a stent placed. She was last seen by her cardiologist in May 2017 and was told everything was "fine".  Her weight has been stable if not slightly up. She's been trying to lose weight. She denies any bleeding or discharge. 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 She surprised that they did not remove it when she had  her ovaries removed. She had her last mammogram at many years ago. She had her last colonoscopy in 2015, it was recommended that she have follow-up in 10 years. Her Pap smears are up-to-date. She did have an abnormal 1 many years ago. She had a repeat Pap smear which returned as normal.  She did  have a superficial thrombophlebitis in her legs after undergoing a laparoscopic cholecystectomy. She did aspirin for this. She also developed a phlebitis had an IV site in her right arm after she had her parathyroid gland removed.  Review of Systems: Constitutional: Denies fever. Skin: No rash Cardiovascular: always has chest pain and shortness of breath, no edema  Pulmonary: No cough Gastro Intestinal: Reporting intermittent lower abdominal soreness.  No nausea, vomiting, + constipation, no diarrhea reported. Genitourinary: No frequency, urgency, or dysuria.  Denies vaginal bleeding and discharge.  Musculoskeletal: No joint swelling or pain.   Current Meds:  Outpatient Encounter Prescriptions as of 05/08/2016  Medication Sig  . aspirin EC 81 MG tablet Take 81 mg by mouth daily.  Marland Kitchen atenolol (TENORMIN) 25 MG tablet 1 tablet daily and an extra 1/2 tablet as needed for palpitations (Patient taking differently: Take 25 mg by mouth daily. )  . Cholecalciferol ([employer] VIT D 5000 HIGH-POTENCY) 5000 UNITS capsule Take 5,000 Units by mouth daily.  Marland Kitchen dexlansoprazole (DEXILANT) 60 MG capsule Take 60 mg by mouth daily.  . enalapril (VASOTEC) 5 MG tablet 1 tablet daily and an extra tablet if systolic BP is higher than 150 (Patient taking differently: Take 5 mg by mouth daily. 1 tablet daily and an extra tablet if systolic BP is higher than 150)  . EPINEPHrine (EPIPEN 2-PAK) 0.3 mg/0.3 mL SOAJ injection Inject 0.3 mg into the muscle daily as needed (allergic reaction).   Marland Kitchen escitalopram (LEXAPRO) 20 MG tablet Take 20 mg by mouth daily.   Marland Kitchen HYDROmorphone (DILAUDID) 2 MG tablet Take 0.5 tablets (1 mg total) by mouth every 6 (six) hours as needed for severe pain.  Marland Kitchen ibuprofen (ADVIL,MOTRIN) 200 MG tablet Take 400-800 mg by mouth every 8 (eight) hours as needed for mild pain or moderate pain.  . nitroGLYCERIN (NITROSTAT) 0.4 MG SL tablet Place 0.4 mg under the tongue every 5 (five) minutes as needed for chest pain.    No facility-administered encounter medications on file as of 05/08/2016.     Allergy:  Allergies  Allergen Reactions  . Contrast Media [Iodinated Diagnostic Agents] Anaphylaxis    IV dyes  . Iohexol Shortness Of Breath  . Bee Venom Swelling    Social Hx:   Social History   Social History  . Marital status: Married    Spouse name: N/A  . Number of children: N/A  . Years of education: N/A   Occupational History  . Not on file.   Social History Main Topics  . Smoking status: Former Smoker    Years: 25.00    Types: Cigarettes  . Smokeless tobacco: Never Used     Comment: "quit in smoking in ~ 2004"  . Alcohol use No  . Drug use: No  . Sexual activity: Yes   Other Topics Concern  . Not on file   Social History Narrative  . No narrative on file    Past Surgical Hx:  Past Surgical History:  Procedure Laterality Date  . BILATERAL SALPINGOOPHORECTOMY Bilateral   . BREAST LUMPECTOMY Right    "took out golf-ball sized growth"  . CARDIAC CATHETERIZATION  2007  . CARPAL TUNNEL  RELEASE Left   . CESAREAN SECTION  1976; 1977; 1980  . CHOLECYSTECTOMY  01/2006  . FRACTURE SURGERY    . NECK EXPLORATION  01/31/2015  . PARATHYROIDECTOMY  01/31/2015  . PARATHYROIDECTOMY N/A 01/31/2015   Procedure: NECK EXPLORATION WITH PARATHYROIDECTOMY;  Surgeon: Armandina Gemma, MD;  Location: [place];  Service: General;  Laterality: N/A;  . TONSILLECTOMY    . TUBAL LIGATION  1980  . WRIST FRACTURE SURGERY Left 2014    Past Medical Hx:  Past Medical History:  Diagnosis Date  . Anemia    "when I was little"  . Anxiety   . Arrhythmia    cardiac dysrhythmia, tachycardia  . Arthritis    "lower back" (01/31/2015)  . Chest pain   . Complication of anesthesia    hard to wake up --  gallbladder here @ [facility]  . Depression   . DVT (deep venous thrombosis) ([place]) 01/2006   "legs; after my gallbladder OR"  . Fibromyalgia   . Gallstones   . GERD (gastroesophageal reflux disease)   . Headache     "weekly" (01/31/2015)  . Hx of echocardiogram    Echo (11/15):  Mild LVH, EF 55-60%, GLPSS mildly reduced (-16%), Gr 1 DD  . Hx of echocardiogram    Lexiscan Myoview (11/15):  Low risk; Fixed, small mild apical anterior and apical inferolateral perfusion defects. Suspect soft tissue attenuation given normal wall motion. No ischemia. EF 71%.  . Hyperglycemia   . Hyperlipidemia   . Hypertension   . Kidney anomaly, congenital    "falulty plumbing"  . Kidney stones   . Ovarian cancer ([place])    beginnings of ovarian cancer "both"  . Parathyroid abnormality ([place])   . Pneumonia X 2  . PVD (peripheral vascular disease) ([place])   . Restless leg syndrome     Oncology Hx:   No history exists.    Family Hx:  Family History  Problem Relation Age of Onset  . Hypertension Mother   . Hypothyroidism Mother   . Prostate cancer Father   . Heart attack Brother   . Heart attack Maternal Grandmother   . Heart attack Maternal Grandfather   . Heart attack Paternal Grandfather   . Stroke Neg Hx     Vitals:  There were no vitals taken for this visit.  Physical Exam: Well-nourished vulva female in no acute distress.  Neck: Supple, no lymphadenopathy, no thyromegaly. Well-healed surgical incision.  Lungs: Clear to auscultation but he.  Cardiac Astro: Regular rate and rhythm.  Abdomen: Obese, soft, tender in the lower quadrants. No rebound or guarding. No fluid wave. Exam is somewhat limited by habitus.  Groins: No lymphadenopathy.  Extremities: No edema.  Pelvic: Genitalia within normal limits. Vagina is atrophic. Cervix is without lesions, it is displaced behind the pubic symphysis. There is no visible lesions. Bimanual examination reveals an abdominal pelvic mass measuring approximately 15 cm. It is mobile. She is tender. Rectal examination reveals no nodularity.  Assessment/Plan: 60 year old with personal history of bilateral borderline tumors in 2006. This is per report and we do not  have any pathology to confirm this. She now has a large complex abdominopelvic mass in the setting of normal tumor markers. 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 I do believe we need to proceed with surgery. Her surgery will involve exploratory laparotomy hysterectomy and removal of the mass and appropriate staging. Even if the mass is not malignant I would consider postoperative anticoagulation secondary to  her history of a superficial thrombophlebitis in her legs after laparoscopic cholecystectomy. This was discussed with the patient and she is willing to proceed with postoperative Lovenox. She does recall that her CA-125 was elevated in 2006 that she does not recall exactly how high the number was.  Risks of surgery including but not limited to bleeding, infection, injury to surrounding organs, and thromboembolic disease were discussed with the patient. She was given written instructions will be seen in the preoperative clinic.  We will contact her cardiologist Dr. Earlie Counts in [facility] to obtain preoperative risk stratification secondary to the patient's history of tachycardia and complaints that she always has chest pain or shortness of breath.  She understands the surgery will be performed by one of my partners. Her surgery is tentatively scheduled for September 7 with Dr. Skeet Latch. However, this will of course be pending any evaluation of a be requested by her cardiologist.   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 I told her to anticipate 4-6 weeks out of work for this procedure.  Alonso Gapinski A., MD 05/08/2016, 12:45 PM

## 2016-05-16 NOTE — Anesthesia Postprocedure Evaluation (Signed)
Anesthesia Post Note  Patient: [name]  Procedure(s) Performed: Procedure(s) (LRB): EXPLORATORY LAPAROTOMY (N/A) HYSTERECTOMY TOTAL  ABDOMINAL WITH PELVIC MASS REMOVAL ,INFRACOLIC OMENTECTOMY (N/A)  Patient location during evaluation: PACU Anesthesia Type: General Level of consciousness: awake and alert Pain management: pain level controlled Vital Signs Assessment: post-procedure vital signs reviewed and stable Respiratory status: spontaneous breathing, nonlabored ventilation, respiratory function stable and patient connected to nasal cannula oxygen Cardiovascular status: blood pressure returned to baseline and stable Postop Assessment: no signs of nausea or vomiting Anesthetic complications: no    Last Vitals:  Vitals:   05/16/16 1130 05/16/16 1145  BP:  (!) 99/57  Pulse: 62 62  Resp: 15 15  Temp: 36.7 C 36.6 C    Last Pain:  Vitals:   05/16/16 1204  TempSrc:   PainSc: 8                  [name] J

## 2016-05-16 NOTE — Op Note (Signed)
Preoperative Diagnosis:  History of low malignant potential tumor,  Left adnexal mass.  Postoperative Diagnosis:  History of low malignant potential tumor, Left pelvic peritoneal inclusion cyst inclusion cyst .   Procedure(s) Performed: 1. Exploratory laparotomy with total hysterectomy, resection of left pelvic mass,  infracolic omentectomy washings .  Surgeon: Francetta Found.  Skeet Latch, MD., PhD  Assistant Surgeon: Chrissie Noa, M.D. Assistant:   Specimens: Pelvic washings, uterus cervix, left adnexal mass,  infracolic omentum.   Estimated Blood Loss: 100 mL.    Complications: None.   Operative Findings: A fixed left 14 cm mass adherent to the ureter, uterus, pelvic sidewall, extending into the rectovaginal septum. No evidence of intra-abdominal pelvic or peritoneal metastases no intra-peritoneal rupture occurred; Absent ovaries and tubes, no intra-abdominal masses, normal liver surface, normal kidney surface, no adenopathy appreciated.  Frozen pathology was consistent with peritoneal inclusion cyst  Procedure:   The patient was seen in the Holding Room. The risks, benefits, complications, treatment options, and expected outcomes were discussed with the patient.  The patient concurred with the proposed plan, giving informed consent.   The patient was  identified and the procedure verified as hysterectomy, resection of pelvic mass, possible staging. A Time Out was held and the above information confirmed upon entry to the operating room..  After induction of anesthesia, the patient was draped and prepped in the usual sterile manner.  She was prepped and draped in the normal sterile fashion in the dorsal lithotomy position in padded Allen stirrups with good attention paid to support of the lower back and lower extremities. Position was adjusted for appropriate support. A Foley catheter was placed to gravity.   A midline vertical incision was made and carried through the subcutaneous tissue to the  fascia. The fascial incision was made and extended superiorally. The rectus muscles were separated. The peritoneum was identified and entered. Peritoneal incision was extended longitudinally.  The abdominal cavity was entered sharply and without incident. A Bookwalter retractor was then placed. A survey of the abdomen and pelvis revealed the above findings.  Pelvic washings were collected.    After packing the small bowel into the upper abdomen, we began the procedure by entering the  pelvic sidewall through the left round ligament. The pararectal space was developed and the retroperitoneum developed up to the level of the common iliac artery.  The course of the ureter was identified with ease. The left  IP was absent.  The mass was dissected free from the ureter,  peritoneal adhesions, lateral uterus and pelvic sidewall.  The mass was delivered and sent to pathology.     Kelly clamps were placed on both uterine cornua. The right round ligaments was transected with the bovie. The anterior peritoneal reflection was incised and the bladder was dissected off the lower uterine segment and cervix. The retroperitoneal space was explored and the ureters were identified bilaterally. The uterine vessels were skeletonized bilaterally , then clamped, cut and transected with the Ligasure. Serial pedicles of the cardinal and utero-sacral ligaments were clamped and transected. Clamps were placed inferior to the cervix and the specimen amputated.  . Entrance was made into the vagina and the cervix amputated. The vaginal cuff was then closed in the standard [employer] [facility] fashion with 0 Vicryl suture.   The pelvis was copiously irrigated. Hemostasis was observed.   An infracolic omentectomy was performed using a Ligasure.  Hemostasis was assured.  Exparil and Marcaine were infiltrated into the anterior abdominal wall immediately lateral to the incision  The fascia was reapproximated with 0 looped PDS using a total of two  sutures. The subcutaneous layer was then irrigated copiously.  The subcutaneous layer was reapproximated with running 2-0 Vicryl. The subcutaneous layer was then reapproximated with a running 4-0 Monocryl. The patient tolerated the procedure well.   Sponge, lap and needle counts were correct x 2.    The patient had sequential compression devices for VTE prophylaxis and will receive Lovenox postoperatively. The patient was extubated and taken to the recovery room in stable condition.

## 2016-05-16 NOTE — Anesthesia Postprocedure Evaluation (Signed)
Anesthesia Post Note  Patient: [name]  Procedure(s) Performed: Procedure(s) (LRB): EXPLORATORY LAPAROTOMY (N/A) HYSTERECTOMY TOTAL  ABDOMINAL WITH PELVIC MASS REMOVAL ,INFRACOLIC OMENTECTOMY (N/A)  Anesthesia Post Evaluation  Last Vitals:  Vitals:   05/16/16 0619  BP: 116/79  Pulse: 70  Resp: 16  Temp: 36.8 C    Last Pain:  Vitals:   05/16/16 0647  TempSrc:   PainSc: 2                  Campbell Lerner

## 2016-05-16 NOTE — Anesthesia Procedure Notes (Signed)
Date/Time: 05/16/2016 8:24 AM Performed by: Chandra Batch A Pre-anesthesia Checklist: Patient identified, Timeout performed, Emergency Drugs available, Suction available and Patient being monitored Patient Re-evaluated:Patient Re-evaluated prior to inductionOxygen Delivery Method: Circle system utilized Preoxygenation: Pre-oxygenation with 100% oxygen Intubation Type: IV induction Ventilation: Mask ventilation without difficulty Laryngoscope Size: Mac and 3 Grade View: Grade I Tube type: Oral Tube size: 7.5 mm Number of attempts: 1 Airway Equipment and Method: Stylet Secured at: 21 cm Tube secured with: Tape Dental Injury: Teeth and Oropharynx as per pre-operative assessment

## 2016-05-16 NOTE — Interval H&P Note (Signed)
History and Physical Interval Note:  05/16/2016 8:15 AM  [name]  has presented today for surgery, with the diagnosis of PELVIC MASS  The various methods of treatment have been discussed with the patient and family. After consideration of risks, benefits and other options for treatment, the patient has consented to  Procedure(s): EXPLORATORY LAPAROTOMY (N/A) HYSTERECTOMY TOTAL  ABDOMINAL WITH PELVIC MASS REMOVAL POSSIBLE STAGING (N/A) as a surgical intervention .  The patient's history has been reviewed, patient examined, no change in status, stable for surgery.  I have reviewed the patient's chart and labs.  Questions were answered to the patient's satisfaction.     [place], [facility]

## 2016-05-16 NOTE — Transfer of Care (Signed)
Immediate Anesthesia Transfer of Care Note  Patient: [name]  Procedure(s) Performed: Procedure(s): EXPLORATORY LAPAROTOMY (N/A) HYSTERECTOMY TOTAL  ABDOMINAL WITH PELVIC MASS REMOVAL ,INFRACOLIC OMENTECTOMY (N/A)  Patient Location: PACU  Anesthesia Type:General  Level of Consciousness: awake, alert  and oriented  Airway & Oxygen Therapy: Patient Spontanous Breathing and Patient connected to face mask oxygen  Post-op Assessment: Report given to RN and Post -op Vital signs reviewed and stable  Post vital signs: Reviewed and stable  Last Vitals:  Vitals:   05/16/16 0619  BP: 116/79  Pulse: 70  Resp: 16  Temp: 36.8 C    Last Pain:  Vitals:   05/16/16 0647  TempSrc:   PainSc: 2          Complications: No apparent anesthesia complications

## 2016-05-17 LAB — BASIC METABOLIC PANEL
ANION GAP: 4 — AB (ref 5–15)
BUN: 11 mg/dL (ref 6–20)
CHLORIDE: 106 mmol/L (ref 101–111)
CO2: 27 mmol/L (ref 22–32)
Calcium: 10.5 mg/dL — ABNORMAL HIGH (ref 8.9–10.3)
Creatinine, Ser: 1.03 mg/dL — ABNORMAL HIGH (ref 0.44–1.00)
GFR, EST NON AFRICAN AMERICAN: 59 mL/min — AB (ref >60)
Glucose, Bld: 123 mg/dL — ABNORMAL HIGH (ref 65–99)
POTASSIUM: 4.9 mmol/L (ref 3.5–5.1)
SODIUM: 137 mmol/L (ref 135–145)

## 2016-05-17 LAB — CBC
HCT: 39.7 % (ref 36.0–46.0)
HEMOGLOBIN: 13.5 g/dL (ref 12.0–15.0)
MCH: 31.8 pg (ref 26.0–34.0)
MCHC: 34 g/dL (ref 30.0–36.0)
MCV: 93.6 fL (ref 78.0–100.0)
PLATELETS: 188 10*3/uL (ref 150–400)
RBC: 4.24 MIL/uL (ref 3.87–5.11)
RDW: 12.3 % (ref 11.5–15.5)
WBC: 11.1 10*3/uL — AB (ref 4.0–10.5)

## 2016-05-17 MED ORDER — ENOXAPARIN (LOVENOX) PATIENT EDUCATION KIT
PACK | Freq: Once | Status: AC
  Administered 2016-05-17: 16:00:00
  Filled 2016-05-17: qty 1

## 2016-05-17 NOTE — Progress Notes (Signed)
1 Day Post-Op Procedure(s) (LRB): EXPLORATORY LAPAROTOMY (N/A) HYSTERECTOMY TOTAL  ABDOMINAL WITH PELVIC MASS REMOVAL ,INFRACOLIC OMENTECTOMY (N/A)  Subjective: Patient reports tolerating chicken broth this am with no nausea or emesis.  Ambulated last pm without difficulty.  Reports having one episode of chest pain that has resolved.  Pain controlled with PRN medications.  Due to void.  Denies passing flatus, having a bowel movement, dyspnea, chest pain at this time.  No concerns voiced.   Objective: Vital signs in last 24 hours: Temp:  [97.5 F (36.4 C)-98.2 F (36.8 C)] 98.2 F (36.8 C) (09/08 0500) Pulse Rate:  [57-71] 71 (09/08 0500) Resp:  [15-16] 16 (09/08 0500) BP: (86-123)/(54-63) 121/54 (09/08 0500) SpO2:  [94 %-100 %] 95 % (09/08 0500)    Intake/Output from previous day: 09/07 0701 - 09/08 0700 In: 3283.3 [I.V.:3283.3] Out: 3025 [Urine:2950; Blood:75]  Physical Examination: General: alert, cooperative and no distress Resp: clear to auscultation bilaterally Cardio: regular rate and rhythm, S1, S2 normal, no murmur, click, rub or gallop GI: soft, non-tender; bowel sounds normal; no masses,  no organomegaly and incision: low midline incision with honeycomb dressing, lightly stained on the lower portion but dry, no active drainage or bleeding noted Extremities: extremities normal, atraumatic, no cyanosis or edema  Labs: WBC/Hgb/Hct/Plts:  11.1/13.5/39.7/188 (09/08 0450) BUN/Cr/glu/ALT/AST/amyl/lip:  11/1.03/--/--/--/--/-- (09/08 0450)  Assessment: 60 y.o. s/p Procedure(s): EXPLORATORY LAPAROTOMY HYSTERECTOMY TOTAL  ABDOMINAL WITH PELVIC MASS REMOVAL ,INFRACOLIC OMENTECTOMY: stable Pain:  Pain is well-controlled on PRN medications.  Heme: Hgb 13.5 and Hct 39.7 this am. Stable post-operatively.  CV: BP and HR stable.  Atenolol and Vasotec ordered.    GI:  Tolerating po: Yes.     GU: Due to void.  Foley removed this am.  Adequate output reported.      FEN: Stable  post-operatively.  Prophylaxis: intermittent pneumatic compression boots and lovenox once daily.  Plan: Advance diet as tolerated Saline lock IV Lovenox teaching kit for 2 weeks for lovenox post-op Encourage ambulation, IS use, deep breathing, and coughing Continue post-operative plan of care per Dr. Delsa Sale   LOS: 1 day    CROSS, MELISSA DEAL 05/17/2016, 9:26 AM   Patient seen and examined by me this morning. I agree with the NP note and findings. The patient is doing very well on POD 1. She had an episode of chest pain this morning which was not associated with abnormalities in vital signs. This has completley resolved and she had a negative preop cardiac workup. Will continue to monitor. Will plan on potential discharge on POD 2.  Donaciano Eva, MD

## 2016-05-18 MED ORDER — ENOXAPARIN SODIUM 40 MG/0.4ML ~~LOC~~ SOLN: 40.0000 mg | SUBCUTANEOUS | 0 refills | Status: DC

## 2016-05-18 MED ORDER — TRAMADOL HCL 50 MG PO TABS: 100.0000 mg | ORAL_TABLET | Freq: Four times a day (QID) | ORAL | 0 refills | Status: DC

## 2016-05-18 NOTE — Progress Notes (Signed)
Patient alert and oriented with pain controlled. Upon discharge patient was able to verbalize and demonstrate the proper technique to get self enoxaparin injections for home use. Patient is comfortable and self completing task. Patient also has kit in possession for additional instructions for home.

## 2016-05-18 NOTE — Discharge Summary (Signed)
Physician Discharge Summary  Patient ID: [name] MRN: [id number] DOB/AGE: 11-15-1955 60 y.o.  Admit date: 05/16/2016 Discharge date: 05/18/2016  Admission Diagnoses: Pelvic mass in female  Discharge Diagnoses:  Principal Problem:   Pelvic mass in female   Discharged Condition: good  Hospital Course: On 05/16/2016, the patient underwent the following: Procedure(s): EXPLORATORY LAPAROTOMY HYSTERECTOMY TOTAL  ABDOMINAL WITH PELVIC MASS REMOVAL ,INFRACOLIC OMENTECTOMY.   The postoperative course was uneventful.  She was discharged to home on postoperative day 3 tolerating a regular diet.  Consults: None  Significant Diagnostic Studies: None  Treatments: surgery: see above  Discharge Exam: Blood pressure 119/61, pulse 68, temperature 97.9 F (36.6 C), temperature source Oral, resp. rate 16, height 5\' 2"  (1.575 m), weight 200 lb (90.7 kg), SpO2 93 %. General appearance: alert Resp: clear to auscultation bilaterally Cardio: regular rate and rhythm, S1, S2 normal, no murmur, click, rub or gallop GI: soft, non-tender; bowel sounds normal; no masses,  no organomegaly Extremities: extremities normal, atraumatic, no cyanosis or edema and Homans sign is negative, no sign of DVT Incision/Wound: C/D/I  Disposition: 01-Home or Self Care  Discharge Instructions    Call MD for:  extreme fatigue    Complete by:  As directed   Call MD for:  persistant dizziness or light-headedness    Complete by:  As directed   Call MD for:  persistant nausea and vomiting    Complete by:  As directed   Call MD for:  redness, tenderness, or signs of infection (pain, swelling, redness, odor or green/yellow discharge around incision site)    Complete by:  As directed   Call MD for:  severe uncontrolled pain    Complete by:  As directed   Call MD for:  temperature >100.4    Complete by:  As directed   Diet - low sodium heart healthy    Complete by:  As directed   Discharge instructions    Complete by:  As  directed   Return to work: 6 weeks  Activity: 1. Be up and out of the bed during the day.  Take a nap if needed.  You may walk up steps but be careful and use the hand rail.  Stair climbing will tire you more than you think, you may need to stop part way and rest.   2. No lifting or straining for 6 weeks.  3. No driving for 1-2 weeks.  Do Not drive if you are taking narcotic pain medicine.  4. Shower daily.  Use soap and water on your incision and pat dry; don't rub.   5. No sexual activity and nothing in the vagina for 4 weeks.  Diet: 1. Low sodium Heart Healthy Diet is recommended.  2. It is safe to use a laxative if you have difficulty moving your bowels.   Wound Care: 1. Keep clean and dry.  Shower daily.  Reasons to call the Doctor:  Fever - Oral temperature greater than 100.4 degrees Fahrenheit Foul-smelling vaginal discharge Difficulty urinating Nausea and vomiting Increased pain at the site of the incision that is unrelieved with pain medicine. Difficulty breathing with or without chest pain New calf pain especially if only on one side Sudden, continuing increased vaginal bleeding with or without clots.   Follow-up: 1. See Everitt Amber in 4 weeks.  Contacts: For questions or concerns you should contact:  Dr. Everitt Amber at [phone]  or at [place]   Discharge wound care:    Complete by:  As directed   Keep clean and dry   Driving Restrictions    Complete by:  As directed   No driving for 1- 2 weeks   Increase activity slowly    Complete by:  As directed   Lifting restrictions    Complete by:  As directed   No lifting > 5 lbs for 6 weeks   May shower / Bathe    Complete by:  As directed   No tub baths for 6 weeks   Sexual Activity Restrictions    Complete by:  As directed   No intercourse for 6 - 8 weeks       Medication List    STOP taking these medications   HYDROmorphone 2 MG tablet Commonly known as:  DILAUDID     TAKE these  medications   aspirin EC 81 MG tablet Take 81 mg by mouth daily.   atenolol 25 MG tablet Commonly known as:  TENORMIN 1 tablet daily and an extra 1/2 tablet as needed for palpitations What changed:  how much to take  how to take this  when to take this  additional instructions   [employer] VIT D 5000 HIGH-POTENCY 5000 units capsule Generic drug:  Cholecalciferol Take 5,000 Units by mouth daily.   DEXILANT 60 MG capsule Generic drug:  dexlansoprazole Take 60 mg by mouth daily.   enalapril 5 MG tablet Commonly known as:  VASOTEC 1 tablet daily and an extra tablet if systolic BP is higher than 150 What changed:  how much to take  how to take this  when to take this  additional instructions   enoxaparin 40 MG/0.4ML injection Commonly known as:  LOVENOX Inject 0.4 mLs (40 mg total) into the skin daily.   EPIPEN 2-PAK 0.3 mg/0.3 mL Soaj injection Generic drug:  EPINEPHrine Inject 0.3 mg into the muscle daily as needed (allergic reaction).   escitalopram 20 MG tablet Commonly known as:  LEXAPRO Take 20 mg by mouth daily.   ibuprofen 200 MG tablet Commonly known as:  ADVIL,MOTRIN Take 400-800 mg by mouth every 8 (eight) hours as needed for mild pain or moderate pain.   nitroGLYCERIN 0.4 MG SL tablet Commonly known as:  NITROSTAT Place 0.4 mg under the tongue every 5 (five) minutes as needed for chest pain.   traMADol 50 MG tablet Commonly known as:  ULTRAM Take 2 tablets (100 mg total) by mouth every 6 (six) hours.        SignedLahoma Crocker A 05/18/2016, 11:24 AM

## 2016-05-18 NOTE — Discharge Instructions (Signed)
Planning for Recovery and Going Home Your Guide to Gynecologic Surgery     In-Hospital Recovery Plan Team Caring for You After Surgery In addition to the nursing staff on the unit, the gynecological surgery team will care for you. This team is led by your surgeon and includes a resident in his last year of training, as well as other residents, medical students and a physician assistant or nurse practitioner. There will be a physician in the hospital 24 hours a day to tend to your needs. The residents and students report directly to your surgeon, who is the one overseeing all of your care.  Pain Relief After Surgery Your pain will be assessed regularly on a scale from 0 to 10. Pain assessment is  necessary to guide your pain relief. It is essential that you are able to take deep breaths, cough and move. Prevention or early treatment of pain is far more effective than trying to treat severe pain. Therefore, we have devised a specialized regimen to stay ahead of your pain and use almost no narcotics, which can slow down your recovery process. If you have an epidural catheter, you will receive a  constant infusion of pain medication through your epidural. If you need additional pain relief, you will be able to push a button to increase the medication in your epidural. You will also be given acetaminophen and an ibuprofen-like medication to keep your pain under control.  You can always ask for additional pain pills if you are not comfortable. In most cases an anesthesiologist with expertise in pain management will visit you every day and help design your pain management plan.  One Day After Surgery Focus on drinking and walking. You will start drinking clear liquids after surgery. The intravenous fluids will be stopped, and the catheter may be removed  from your bladder. We expect you to get out of bed, with the nurses' or assistants' help, sit in a chair for six hours and start to move  about in the hallways. You will also meet with a case manager to assess your discharge needs, including home nursing. Your physician may order home care to assist with your transition home.  Home nursing visits, which are intermittent, help you get readjusted to home by teaching treatments, monitoring medications, and performing clinical assessment and reporting back to your physician. Other services may include therapy and medical equipment; private duty services are also available. If you are going "home" to a different address upon discharge, please alert [place]. A Home Care Coordinator can visit with you while in the hospital to discuss your options. If you have questions please speak with your case manager. If you need rehabilitation at a facility, a social worker will assist with this. If you need rehabilitation at a facility, a social worker will assist with this. If your procedure was performed in a minimally invasive fashion, you will be discharged to home if your pain is well controlled and you are tolerating a regular diet.     Two Days After Surgery You will start eating a soft diet and change to a more solid diet as you feel up to it. The catheter from your bladder will be removed, if not already done so. If there is a dressing on your wound, it will be removed. The tubing will be disconnected from your IV. We expect you to be out of bed for the majority of the day and walking at least three times in the hallway, with assistance as  needed.  You may be discharged at this point if it is felt you are ready.   Three Days After Surgery You continue to eat your low residue diet. You may be ready to go home if you are drinking enough to keep yourself hydrated, your pain is well controlled, you are not belching or nauseated, you are passing gas and you are able to get around on your own. However, we will not discharge you from the hospital until we are sure you  are ready.  Discharge Discharge time is at 10 a.m. You will need to make arrangements for someone to accompany you home. You will not be released without someone present. Please keep in mind that we strive to get patients discharged as quickly as possible, but there may be delays for a variety of reasons. Complications That May Delay Discharge: ? Nausea and vomiting: It is very common to feel sick after your surgery. We give you medication to reduce this. However, if you do feel sick, you should reduce the amount you are taking by mouth. Small, frequent meals or drinks are best in this  situation. As long as you can drink and keep yourself hydrated, the nausea will likely pass.  Ileus: Following surgery, the bowel can be sluggish, making it difficult for food and gas to pass through the intestines. This is called an ileus. We have designed our care program to do everything possible to reduce the likelihood of an ileus. If you do develop an ileus, it usually only lasts two to three days. However, it may require a small tube down the nose to decompress the stomach. The best way to avoid an ileus is to reduce the amount of narcotic pain medications, get up as much as possible after your surgery, and stimulate the bowel early after surgery with small amounts of food and liquids.  Wound infection: If a wound infection develops, this usually happens three to ten days after surgery.    Urinary retention: This is if you are unable to urinate after the catheter from your bladder is removed. The catheter may need to be reinserted until you are able to urinate on your own. This can be caused by anesthesia, pain medication and decreased activity.    When you are preparing to go home, you will receive:  Detailed discharge instructions, with information about your operation and medications    All prescriptions for medications you need at home; prescriptions can be filled while you are in the  hospital if you would like    You may be prescribed Lovenox. Lovenox is used to reduce the risk of developing a blood clot after surgery. An appointment to see your surgeon or provider one to two weeks after you leave the hospital for follow-up   After Discharge Once you are discharged: Call us at any time if you are worried about your recovery or if you should have any questions. During regular office hours, (8:30 a.m.-4:30 p.m.), and after hours call [phone].  Call us immediately if:  You have a fever higher than 100.4 degrees.   Your wound is red, more painful or has drainage.    You are nauseated, vomiting or can't keep liquids down.    Your pain is worse and not able to be controlled with the regimen you were sent home with.    If you are bleeding heavily or have a lot of fluid coming from your vagina. If you are on narcotics, the goal is to  wean you off of them. If you are running low on supply and need more, call the nurse a few days before you will run out.  It is generally easier to reach someone between 8:30 a.m. - 4:30 p.m., so call early if you think something is not right. A nurse or nurse practitioner is available every day to answer your questions. After hours and on the weekends, the calls go to the resident doctors in the hospital. It may take longer for your phone call to be returned during this time. If you have a true emergency, such as severe abdominal pain, chest pain, shortness of breath or any other acute issues, call 911 and go to the local emergency room. Have them contact our team once you are stable.  Concerns After Discharge Bowel Function Following Your Surgery Your bowels will take several weeks to settle down and may be unpredictable at first. Your bowel movements may become loose, or you may be constipated. For the vast number of patients, this will get back to normal with time. Make sure you eat nutritious meals, drink plenty of fluids  and take regular walks during the first two weeks after your operation. Your Guide to Gynecologic Surgery    Abdominal Pain It is not unusual to suffer gripping pains (colic) during the first week following removal of a portion of your bowel. This pain usually lasts for a few minutes but goes away between spasms. If you have severe pain lasting more than one to two hours or have a fever and feel generally unwell, you should contact us at  the telephone contact numbers listed at the end of this packet. Hysterectomy: You should have pelvic rest for six (6) weeks or as specified by your doctor after surgery. You should have nothing in the vagina (no tampons, douching, intercourse, etc.,) during this time period. If you have some vaginal spotting, this is normal. If you have heavy bleeding or  a lot of fluid from your vagina, this is NOT normal and you should contact your doctor's office or, if after hours, contact the doctor on call.  Diarrhea: Fiber and Imodium (Loperamide) The first step to improving your frequent or loose stools is to bulk up the stool with fiber. Metamucil is the most common type of fiber that is available at any drug store. Start with 1 teaspoon mixed into food, like yogurt or oatmeal, in the morning and evening. Try not to drink any fluid for one hour after you take the fiber. This will allow the fiber to act like a sponge in your intestines, soaking up all the excess water. Continue this for three to five days. You may increase by 1 teaspoon every three to five days until the desired affect, or you are at 1 tablespoon (3 teaspoons) twice a day. If this doesn't work, you may try over-the-counter Loperamide, which is an antidiarrheal medication. You may take one tablet in the morning and evening or 30 minutes before you typically have diarrhea. You may take up to eight of these tablets daily. It is best to discuss this with [place] prior to using this medication. If you  have continuous diarrhea and abdominal cramping, call [phone].  Foley Catheter Your surgeon may recommend you be discharged home with a foley catheter (bladder catheter) for 1 to 2 weeks. Typically this recommendation will be made for patients undergoing surgery to the lower urinary tract. Before you leave the hospital, your nurse should outfit you with a clip  on the inner thigh to secure the catheter to prevent pulling as well as a small bag that can be easily worn on the upper leg under loose fitting pants and skirts. Your nurse will teach you how to exchange the large bag that typically comes with the catheter for the small bag. You may find it convenient to attach the small bag when active during the day and then the large bag when sleeping at night. If there is ever a point when you notice the catheter is not draining urine and youbegin to develop pain behind/above the pubic bone, you should report to the clinic or emergency room immediately as the catheter may be kinked or clogged. Kinking or clogging of the catheter prevents urine from draining from your bladder. Urine will quickly build up in the bladder and can cause severe pain as well as seriously disrupt healing if you have undergone surgery on the lower urinary tract. Additionally, pulling on the catheter can result in displacement of the balloon at the end of the catheter from inside of to outside  of the bladder. This also results in severe pain and can cause bleeding. For this reason, secure the catheter to the clip on your inner thigh at all times as the clip prevents against pulling.  Wound Care For the first few weeks following surgery, your wound may be slightly red and uncomfortable. You may shower and let the soapy water wash over your incision. Avoid soaking in the tub for one month following surgery or until the wound is well healed. It will take the wound several months to "soften." It is common to have bumpy areas  in the wound near the belly  button and at the ends of the incision.  If you have staples, these should be removed when you are seen by your surgeon at the follow-up appointment. You may have a glue-like material on your incision. Do not pick at this. It will come off over time. It is the surgical glue used in surgery to close your incision. You also have sutures inside of you that will dissolve over time  Post-Surgery Diet Attention to good nutrition after surgery is important to your recovery. If you had no dietary restrictions prior to the surgery, you will have no special dietary restrictions after the surgery. However, consuming enough protein, calories, vitamins and minerals is necessary to support healing. Some patients find their appetite is less than normal after surgery. In this case, frequent small meals throughout the day may help. It is not uncommon to lose 10 to 15 pounds after surgery. However, by the fourth to fifth week, your weight loss should stabilize. It is normal that certain foods taste different and certain smells may make you nauseas. Over time, the amount you can comfortably consume will gradually increase. You should try to eat a balanced diet, which includes:  Foods that are soft, moist, and easy to chew and swallow    Canned or soft-cooked fruits and vegetables   Plenty of soft breads, rice, pasta, potatoes and other starchy foods (lowerfiber  varieties may be tolerated better initially)    High-protein foods and beverages, such as meats, eggs, milk, cottage cheese  or a supplemental nutrition drink like Boost or Ensure    Drink plenty of fluids-at least 8 to 10 cups per day. This includes water,  fruit juice, Gatorade, teas/coffee and milk. Drinking plenty is especially important if you have loose stools (diarrhea).   Avoid drinking a lot of  caffeine, since this may dehydrate you.  ° ° Avoid fried, greasy and highly seasoned or spicy foods.  ° ° Avoid  carbonated beverages in the first couple weeks.  ° ° Avoid raw fruits and vegetables.  ° °Hobbies/Activities °Walking is encouraged after your surgery. You should plan to undertake °regular exercise several times a day and gradually increase this during the °four weeks following your operation until you are back to your normal level °of activity. You may climb stairs. Don't do any heavy lifting greater than 10 °pounds or contact sports for the first month after your surgery. °Generally, you can return to hobbies and activities soon after your surgery. °This will help you recover. °It can take up to two to three months to fully recover. It is not unusual to be °fatigued and require an afternoon nap for up to six to eight weeks following °surgery. Your body is using this energy to heal your wounds. Set small goals °for yourself and try to do a little more each day. ° °Work °It is normal to return to work three to six weeks following your operation. If °your job involves heavy manual work, then you should wait six weeks. °However, you should check with your employer regarding rules, which may °be relevant to your return to work. If you need a return-to-work form for your °employer or disability papers, bring them to your follow-up appointment or °fax them to our office at [phone]. ° °Driving °You may drive when you are off narcotics and pain-free enough to react °quickly with your braking foot. For most patients, this occurs at one to four °weeks following surgery. ° ° °Write down any questions you may have to ask your care team. ° °Important Contact Numbers: °GYN Oncology Office: [phone] °

## 2016-05-21 ENCOUNTER — Telehealth: Payer: Self-pay

## 2016-05-21 NOTE — Telephone Encounter (Signed)
Orders received from [facility] to contact the patient with her surgical pathology report : "no cancer on final pathology report". Also to follow up on the patient postoperatively. Patient contacted and updated with surgical pathology report . Patient states understanding , patient states she doing "well" with the exception of being "constipated" ,states last BM was 9/8 . Patient states she has been taking MOM ,but it has not been effective.Patient instructed to start taking Miralax tonight one capful and if she does not have a BM tomorrow , increase it to tow capfuls daily until she has a BM .Patient was also scheduled the patient for 4 week post-op follow up appointment with Dr Everitt Amber . The patient was scheduled for post-op follow up on October 9 , 2017.

## 2016-05-22 ENCOUNTER — Telehealth: Payer: Self-pay | Admitting: *Deleted

## 2016-05-22 NOTE — Telephone Encounter (Signed)
Pt's husband dropped off FMLA and disability paperwork on 05/16/16. Paperwork has been completed. Pt does not wish for paperwork to be faxed from our office.  Pt's husband (ronnie) will pick up paperwork today. A copy was made and will be sent to HIM.

## 2016-06-17 ENCOUNTER — Ambulatory Visit: Payer: BC Managed Care – PPO | Attending: Gynecologic Oncology | Admitting: Gynecologic Oncology

## 2016-06-17 ENCOUNTER — Encounter: Payer: Self-pay | Admitting: Gynecologic Oncology

## 2016-06-17 VITALS — BP 128/70 | HR 80 | Temp 98.0°F | Resp 18 | Ht 62.0 in | Wt 194.8 lb

## 2016-06-17 DIAGNOSIS — N839 Noninflammatory disorder of ovary, fallopian tube and broad ligament, unspecified: Secondary | ICD-10-CM | POA: Diagnosis not present

## 2016-06-17 DIAGNOSIS — N838 Other noninflammatory disorders of ovary, fallopian tube and broad ligament: Secondary | ICD-10-CM

## 2016-06-17 DIAGNOSIS — D271 Benign neoplasm of left ovary: Secondary | ICD-10-CM

## 2016-06-17 NOTE — Patient Instructions (Signed)
Please call us for any questions or concerns, no follow-up at the cancer center necessary at this time. Please follow-up with Dr Barkley Boards annually for well-woman care.  Please call the cancer center with any questions: [place]

## 2016-06-17 NOTE — Progress Notes (Signed)
FOLLOW-UP NOTE  HPI:  [name] is a 60 y.o. year old initially seen in consultation on 04/28/16 for a history of LMP and a new left adnexal mass.  She then underwent a exploratory laparotomy and hysterectomy, LSO, imfracolic omenetectomy on [id number] without complications.  Her postoperative course was uncomplicated.  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  She is seen today for a postoperative check and to discuss her pathology results and ongoing plan.  Since discharge from the hospital, she is feeling well.  She has improving appetite, normal bowel and bladder function, and pain controlled with minimal PO medication. She has no other complaints today.    Review of systems: Constitutional:  She has no weight gain or weight loss. She has no fever or chills. Eyes: No blurred vision Ears, Nose, Mouth, Throat: No dizziness, headaches or changes in hearing. No mouth sores. Cardiovascular: No chest pain, palpitations or edema. Respiratory:  No shortness of breath, wheezing or cough Gastrointestinal: She has normal bowel movements without diarrhea or constipation. She denies any nausea or vomiting. She denies blood in her stool or heart burn. Genitourinary:  She denies pelvic pain, pelvic pressure or changes in her urinary function. She has no hematuria, dysuria, or incontinence. She has no irregular vaginal bleeding or vaginal discharge Musculoskeletal: Denies muscle weakness or joint pains.  Skin:  She has no skin changes, rashes or itching Neurological:  Denies dizziness or headaches. No neuropathy, no numbness or tingling. Psychiatric:  She denies depression or anxiety. Hematologic/Lymphatic:   No easy bruising or bleeding   Physical Exam: Blood pressure 128/70, pulse 80, temperature 98 F (36.7 C), temperature source Oral, resp. rate 18, height 5\' 2"  (1.575 m), weight 194 lb 12.8 oz (88.4 kg), SpO2 97  %. General: Well dressed, well nourished in no apparent distress.   HEENT:  Normocephalic and atraumatic, no lesions.  Extraocular muscles intact. Sclerae anicteric. Pupils equal, round, reactive. No mouth sores or ulcers. Thyroid is normal size, not nodular, midline. Skin:  No lesions or rashes. Breasts:  Soft, symmetric.  No skin or nipple changes.  No palpable LN or masses. Lungs:  Clear to auscultation bilaterally.  No wheezes. Cardiovascular:  Regular rate and rhythm.  No murmurs or rubs. Abdomen:  Soft, nontender, nondistended.  No palpable masses.  No hepatosplenomegaly.  No ascites. Normal bowel sounds.  No hernias.  Incision is in tact and healing well. Genitourinary: Normal EGBUS  Vaginal cuff intact.  No bleeding or discharge.  No cul de sac fullness. Extremities: No cyanosis, clubbing or edema.  No calf tenderness or erythema. No palpable cords. Psychiatric: Mood and affect are appropriate. Neurological: Awake, alert and oriented x 3. Sensation is intact, no neuropathy.  Musculoskeletal: No pain, normal strength and range of motion.  Assessment:    60 y.o. year old with a remote history of LMP of the right ovary and more recent diagnosis of left ovarian mass.   S/p ex lap, TAH, LSO, omentectomy on 05/16/16 with Dr Skeet Latch revealing benign pathology (no LMP).   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 She was given the opportunity to ask questions, which were answered to her satisfaction, and she is agreement with the above mentioned plan of care.  3)  Return to  clinic with Dr Simona Huh in 1 year. Follow-up with Dr Skeet Latch on a prn basis.  Donaciano Eva, MD

## 2016-06-25 ENCOUNTER — Encounter: Payer: Self-pay | Admitting: Gynecologic Oncology

## 2016-07-02 ENCOUNTER — Ambulatory Visit (INDEPENDENT_AMBULATORY_CARE_PROVIDER_SITE_OTHER): Payer: BC Managed Care – PPO | Admitting: Internal Medicine

## 2016-07-02 ENCOUNTER — Encounter: Payer: Self-pay | Admitting: Internal Medicine

## 2016-07-02 VITALS — BP 130/80 | HR 77 | Ht 62.0 in | Wt 200.0 lb

## 2016-07-02 DIAGNOSIS — E210 Primary hyperparathyroidism: Secondary | ICD-10-CM

## 2016-07-02 LAB — VITAMIN D 25 HYDROXY (VIT D DEFICIENCY, FRACTURES): VITD: 28.91 ng/mL — AB (ref 30.00–100.00)

## 2016-07-02 NOTE — Progress Notes (Signed)
Patient ID: LONYA SCHRENK, female   DOB: [birth year], 58 y.o.   MRN: [id number]   HPI  TASHONA JALIL is a 60 y.o.-year-old female, returning for follow-up for hypercalcemia + h/o primary HPTH and osteoporosis. Last visit 1 year ago.   She was recently found to have 5 L kidney stones. She also has a family history of kidney stones in mother and grandfather.   She had a L uterine mass >> TAH 05/16/2016 >> benign >> still having some pain.  Reviewed and addended history: OP:  We checked a DEXA scan - osteopenia except osteoporosis-range T score at 33% distal radius:  12/11/2014  Lumbar spine (L1-L4) Femoral neck (FN) 33% distal radius  T-score -1.9 RFN:-1.3 LFN:-2.0 -2.8    Fractures:   L wrist >> 2013   L 5th metatarsal >> 01/2014.   She has not been on osteoporosis medications in the past.   No h/o CKD. Last BUN/Cr: Lab Results  Component Value Date   BUN 11 05/17/2016   CREATININE 1.03 (H) 05/17/2016   Pt is not on HCTZ.  She has h/o vitamin D deficiency. She was on Ergocalciferol 50,000 units 1-2 years ago, now on 5000 units daily.  Lab Results  Component Value Date   VD25OH 42.71 07/25/2015   Hyperparathyroidism:  Pt was dx with hypercalcemia when she presented to see PCP for fatigue, nausea, leg pains.   Labs obtained 08/29/2015: -  Calcium 10.5 (8.7-10.2) -  Ionized calcium 1.39 (1.15-1.32) -  PTH 107 (14-72) -  Vitamin D normal, but No value available  Lab Results  Component Value Date   PTH 32 11/21/2014   PTH Comment 11/21/2014   PTH 30 06/13/2014   CALCIUM 10.5 (H) 05/17/2016   CALCIUM 10.9 (H) 05/14/2016   CALCIUM 10.5 (H) 04/11/2016   CALCIUM 10.3 02/04/2015   CALCIUM 9.6 02/01/2015   CALCIUM 10.5 (H) 01/23/2015  11/16/2014: Ca 11.3 05/20/2014: Ca 11 (8.7-10.2), PTH 44 (15-65) 07/28/2013: Ca 10.4 - 1,25 dihydroxy vitamin D was high, at 90 - Urinary calcium was high at 308 - PTHrp normal, at 16 (14-27).  Component     Latest Ref  Rng & Units 06/15/2014  Calcium, Ur     mg/dL 14  Calcium, 24 hour urine     100 - 250 mg/day 308 (H)  Creatinine, Urine     mg/dL 77.7  Creatinine, 24H Ur     700 - 1,800 mg/day 1,709   Due to high suspicion for parathyroid adenoma, we checked:  07/13/2014: Tc sestamibi scan of the parathyroid glands: normal.  Patient then agreed to be referred to surgery for a second opinion. Dr. Harlow Asa ordered: 08/12/2014: Neck ultrasound, that returned negative for parathyroid adenoma.   Due to persistently high suspicion for primary hyperparathyroidism, she had parathyroid exploratory surgery, with right superior parathyroidectomy in 01/2015: Diagnosis 1. Parathyroid gland, right superior - HYPERCELLULAR PARATHYROID TISSUE AND THYROID TISSUE, 0.2 GRAMS. 2. Soft tissue, biopsy, right thyro-thymic tract - BENIGN THYMIC TISSUE. Enid Cutter MD Pathologist, [employer]  She had persistent high PTH and calcium after surgery. Dr. Harlow Asa ordered the following tests:  06/08/2015: Tc sestamibi scan of the parathyroid glands including the mediastinum:   No scintigraphic evidence of parathyroid adenoma  07/13/2015: MRI (Nuclear medicine parathyroid scintigraphy with SPECT imaging using sestamibi 06/08/2015:  No MR evidence for occult parathyroid adenoma on this motion degraded scan. It is unclear if CT of the neck with and without contrast according to parathyroid protocol would provide  additional information, given the previous negative surgical exploration, negative sestamibi scan, as well as lack of apparent findings on today's study.   Per patient's request, I Referred her to [facility] (Dr. Elesa Massed) for a second opinion Re: need for repeat Sx 2/2 persistent hypercalcemia  09/05/2015: Parathyroid scan at [facility]: negative for a possible adenoma  4D CT and venous sampling >> not an option b/c CKD.    Re-intervention was not considered necessary for now.   ROS: Constitutional: no weight  gain, + fatigue Eyes: + blurry vision, no xerophthalmia ENT: no sore throat, no nodules palpated in throat, no dysphagia/odynophagia Cardiovascular: no CP/SOB/palpitations/leg swelling Respiratory: no cough/SOB/no wheezing Gastrointestinal: + N/no V/D/C/ + heartburn Musculoskeletal: no muscle/joint aches Skin: no rashes Neurological: no tremors/numbness/tingling/dizziness, no HA  I reviewed pt's medications, allergies, PMH, social hx, family hx, and changes were documented in the history of present illness. Otherwise, unchanged from my initial visit note:  Past Medical History:  Diagnosis Date  . Anemia    "when I was little"  . Anxiety   . Arrhythmia    cardiac dysrhythmia, tachycardia  . Arthritis    "lower back" (01/31/2015)  . Chest pain   . Complication of anesthesia    hard to wake up --  gallbladder here @ [facility]  . Depression   . DVT (deep venous thrombosis) ([place]) 01/2006   "legs; after my gallbladder OR"  . Fibromyalgia   . Gallstones   . GERD (gastroesophageal reflux disease)   . Headache    "weekly" (01/31/2015)  . Hx of echocardiogram    Echo (11/15):  Mild LVH, EF 55-60%, GLPSS mildly reduced (-16%), Gr 1 DD  . Hx of echocardiogram    Lexiscan Myoview (11/15):  Low risk; Fixed, small mild apical anterior and apical inferolateral perfusion defects. Suspect soft tissue attenuation given normal wall motion. No ischemia. EF 71%.  . Hyperglycemia   . Hyperlipidemia   . Hypertension   . Kidney anomaly, congenital    "faulty plumbing" per pt.  . Kidney stones   . Ovarian cancer ([place])    pre- cancerous- bil BSO done  . Parathyroid abnormality ([place])   . Pneumonia X 2  . PVD (peripheral vascular disease) ([place])   . Restless leg syndrome   . Shortness of breath dyspnea    on exertion- states she recovers her breath quickly upon rest   Past Surgical History:  Procedure Laterality Date  . ABDOMINAL HYSTERECTOMY N/A 05/16/2016   Procedure: HYSTERECTOMY TOTAL   ABDOMINAL WITH PELVIC MASS REMOVAL ,INFRACOLIC OMENTECTOMY;  Surgeon: Janie Morning, MD;  Location: [facility];  Service: Gynecology;  Laterality: N/A;  . BILATERAL SALPINGOOPHORECTOMY Bilateral   . BREAST LUMPECTOMY Right    "took out golf-ball sized growth"  . CARDIAC CATHETERIZATION  2007  . CARPAL TUNNEL RELEASE Left   . CESAREAN SECTION  1976; 1977; 1980  . CHOLECYSTECTOMY  01/2006  . FRACTURE SURGERY    . LAPAROTOMY N/A 05/16/2016   Procedure: EXPLORATORY LAPAROTOMY;  Surgeon: Janie Morning, MD;  Location: [facility];  Service: Gynecology;  Laterality: N/A;  . NECK EXPLORATION  01/31/2015  . PARATHYROIDECTOMY  01/31/2015  . PARATHYROIDECTOMY N/A 01/31/2015   Procedure: NECK EXPLORATION WITH PARATHYROIDECTOMY;  Surgeon: Armandina Gemma, MD;  Location: [place];  Service: General;  Laterality: N/A;  . TONSILLECTOMY    . TUBAL LIGATION  1980  . WRIST FRACTURE SURGERY Left 2014   History   Social History  . Marital Status: Single    Spouse Name:  N/A    Number of Children: 2   Occupational History  . Consulting civil engineer   Social History Main Topics  . Smoking status: Former Smoker -- 27 years, quit in 2005    Types: Cigarettes  . Smokeless tobacco: No  . Alcohol Use: No  . Drug Use: No   Current Outpatient Prescriptions on File Prior to Visit  Medication Sig Dispense Refill  . aspirin EC 81 MG tablet Take 81 mg by mouth daily.    Marland Kitchen atenolol (TENORMIN) 25 MG tablet 1 tablet daily and an extra 1/2 tablet as needed for palpitations (Patient taking differently: Take 25 mg by mouth daily. )    . Cholecalciferol ([employer] VIT D 5000 HIGH-POTENCY) 5000 UNITS capsule Take 5,000 Units by mouth daily.    Marland Kitchen dexlansoprazole (DEXILANT) 60 MG capsule Take 60 mg by mouth daily.    . enalapril (VASOTEC) 5 MG tablet 1 tablet daily and an extra tablet if systolic BP is higher than 150 (Patient taking differently: Take 5 mg by mouth daily. 1 tablet daily and an extra tablet if systolic BP is higher than 150)    .  EPINEPHrine (EPIPEN 2-PAK) 0.3 mg/0.3 mL SOAJ injection Inject 0.3 mg into the muscle daily as needed (allergic reaction).     Marland Kitchen escitalopram (LEXAPRO) 20 MG tablet Take 20 mg by mouth daily.     . nitroGLYCERIN (NITROSTAT) 0.4 MG SL tablet Place 0.4 mg under the tongue every 5 (five) minutes as needed for chest pain.    Marland Kitchen ibuprofen (ADVIL,MOTRIN) 200 MG tablet Take 400-800 mg by mouth every 8 (eight) hours as needed for mild pain or moderate pain.     No current facility-administered medications on file prior to visit.    Allergies  Allergen Reactions  . Contrast Media [Iodinated Diagnostic Agents] Anaphylaxis    IV dyes  . Iohexol Shortness Of Breath  . Bee Venom Swelling   Family History  Problem Relation Age of Onset  . Hypertension Mother   . Hypothyroidism Mother   . Prostate cancer Father   . Heart attack Brother   . Heart attack Maternal Grandmother   . Heart attack Maternal Grandfather   . Heart attack Paternal Grandfather   . Stroke Neg Hx    PE: BP 130/80 (BP Location: Left Arm, Patient Position: Sitting)   Pulse 77   Ht 5\' 2"  (1.575 m)   Wt 200 lb (90.7 kg)   SpO2 97%   BMI 36.58 kg/m  Body mass index is 36.58 kg/m.  Wt Readings from Last 3 Encounters:  07/02/16 200 lb (90.7 kg)  06/17/16 194 lb 12.8 oz (88.4 kg)  05/16/16 200 lb (90.7 kg)   Constitutional: overweight, in NAD. No kyphosis. Eyes: PERRLA, EOMI, no exophthalmos ENT: moist mucous membranes, no thyromegaly, no cervical lymphadenopathy Cardiovascular: RRR, No MRG Respiratory: CTA B Gastrointestinal: abdomen soft, NT, ND, BS+ Musculoskeletal: no deformities, strength intact in all 4 Skin: moist, warm, no rashes Neurological: no tremor with outstretched hands, DTR normal in all 4  Assessment: 1. Hypercalcemia + HPTH  08/12/2014: Thyroid U/S: Solitary small nodule in the right lobe measuring 4 mm. No parathyroid adenoma  2. Osteoporosis - On the DEXA scan obtained on 12/11/2014  - She has  the lowest bone mineral density at the level of the 33% distal radius, which is usually seen in hyperparathyroidism   3. H/o vit D deficiency  Plan:  1. Patient with clinical and lab picture indicative of primary hyperparathyroidism, however,  without an associated lesion on the imaging tests. She had the right superior parathyroidectomy  in 02/2015, with persistently high calcium and nonsuppressed PTH. Postoperative investigation with technetium sestamibi scan and MRI of the chest and neck was negative for parathyroid adenomas or ectopic parathyroid glands. More recently, she had an appointment at [facility] with Dr. Elesa Massed who obtained a new technetium parathyroid scan again without success in localizing a parathyroid lesion. 4D CT and parathyroid venous sampling are not options due to previous anaphylaxis with iodine containing contrast. In this case, with mildly elevated calcium and PTH levels the suggestion was for just expectant management, without intervention. - Her calcium is minimally elevated, as is her PTH, however, she now has kidney stones and she also has a history of osteoporosis at the radius level. Therefore, since she is not a good candidate for surgery for now, I suggested to  start bisphosphonates to lower her calcium levels and improve her bone density. However, this is basically a palliative measure. - Will check a vitamin D level today and if this is normal will proceed with Fosamax 70 mg weekly; we did discuss about possible side effects from Fosamax including atypical fractures and osteonecrosis of the jaw. She does not have dental workup planned for now.  2. Osteopenia - We discussed about her 2016 DEXA scan and  the fact that the lowestBMD  was found at the level of the 33% distal radius, which is usually seen with hyperparathyroidism, and is not typical for postmenopausal bone density decrease.  - I hope that Fosamax will protect her from further fractures  - I plan to repeat  another DEXA scan in 1-1.5 years after starting Fosamax.   3. History of vitamin D deficiency - Will check a vitamin D level today   Component     Latest Ref Rng & Units 07/02/2016 07/02/2016         4:10 PM  4:10 PM  Calcium     8.7 - 10.2 mg/dL  10.2  PTH     15 - 65 pg/mL  39  VITD     30.00 - 100.00 ng/mL 28.91 (L)    PTH and calcium are normal. Vitamin D slightly low, not low enough to impede Fosamax treatment, but would suggest to increase her vitamin D daily dose to 7000 units.  Philemon Kingdom, MD PhD [facility] Endocrinology

## 2016-07-02 NOTE — Patient Instructions (Signed)
Please stop at the lab.  If the vitamin D is normal >> we can start Fosamax 70 mg weekly.  Please return in 6 months.

## 2016-07-04 LAB — PTH, INTACT AND CALCIUM
Calcium: 10.2 mg/dL (ref 8.7–10.2)
PTH: 39 pg/mL (ref 15–65)

## 2016-08-16 ENCOUNTER — Other Ambulatory Visit: Payer: Self-pay | Admitting: Family Medicine

## 2016-08-16 DIAGNOSIS — R1084 Generalized abdominal pain: Secondary | ICD-10-CM

## 2016-08-23 ENCOUNTER — Other Ambulatory Visit: Payer: Self-pay | Admitting: Family Medicine

## 2016-08-23 DIAGNOSIS — R1012 Left upper quadrant pain: Secondary | ICD-10-CM

## 2016-08-23 DIAGNOSIS — Z87898 Personal history of other specified conditions: Secondary | ICD-10-CM

## 2016-09-04 ENCOUNTER — Ambulatory Visit
Admission: RE | Admit: 2016-09-04 | Discharge: 2016-09-04 | Disposition: A | Discharge status: Home / Self Care | Payer: BC Managed Care – PPO | Source: Ambulatory Visit | Attending: Family Medicine | Admitting: Family Medicine

## 2016-09-04 DIAGNOSIS — R1012 Left upper quadrant pain: Secondary | ICD-10-CM

## 2016-09-04 DIAGNOSIS — Z87898 Personal history of other specified conditions: Secondary | ICD-10-CM

## 2016-09-04 MED ORDER — GADOBENATE DIMEGLUMINE 529 MG/ML IV SOLN: 19.0000 mL | Freq: Once | INTRAVENOUS | Status: DC | PRN

## 2016-12-31 ENCOUNTER — Ambulatory Visit: Payer: Self-pay | Admitting: Internal Medicine

## 2017-09-14 IMAGING — MR MR CHEST MEDIASTINUM WO/W CM
11 series · 16 of 16 positions shown · IV contrast (multihance)
Comparison: Nuclear medicine parathyroid scintigraphy with SPECT
imaging using sestamibi 06/08/2015. Previous negative neck
exploration 02/01/2015.

CLINICAL DATA: Continued surveillance for ectopic parathyroid
adenoma. Hypercalcemia.

EXAM:
MR NECK WITHOUT AND WITH CONTRAST; MRI CHEST WITHOUT AND WITH
CONTRAST
TECHNIQUE: Multiplanar, multisequence MR imaging was performed both before and
after administration of intravenous contrast.
CONTRAST:  18mL MULTIHANCE GADOBENATE DIMEGLUMINE 529 MG/ML IV SOLN

[Series 3: T1 · coronal · 5.0mm · 0.47mm/px · 2 of 33 slices shown (1 of 3)]
[im 1/33]
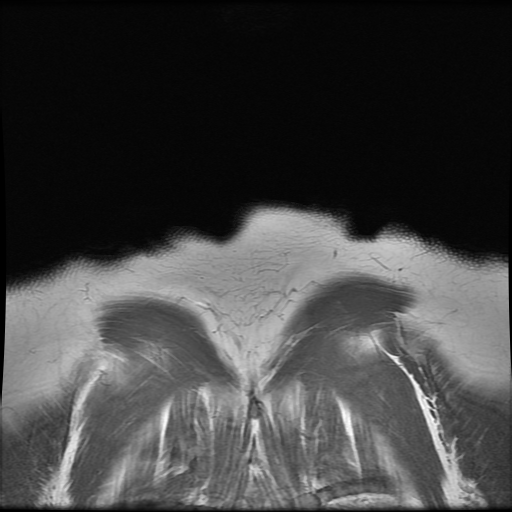
[im 33/33]
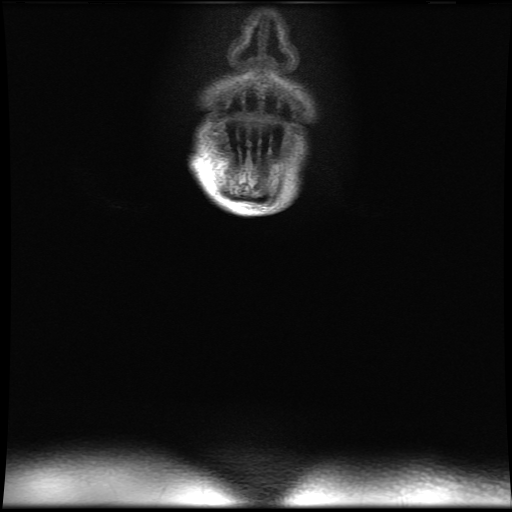

[Series 4: T1 · sagittal · 5.0mm · 0.47mm/px · 3 of 45 slices shown (2 of 3)]
[im 1/45]
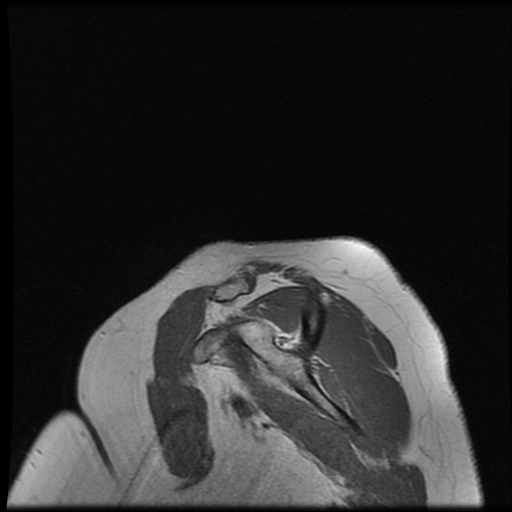
[im 23/45]
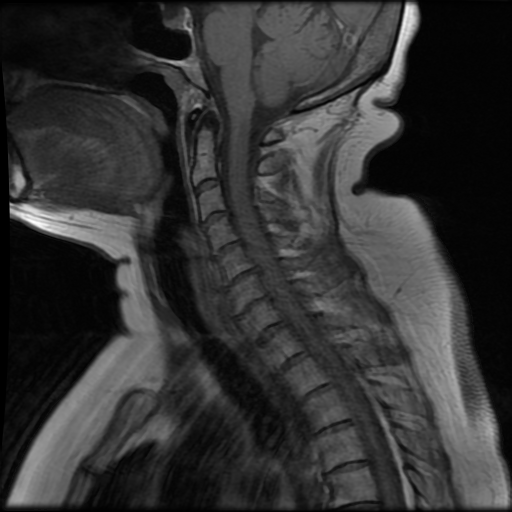
[im 45/45]
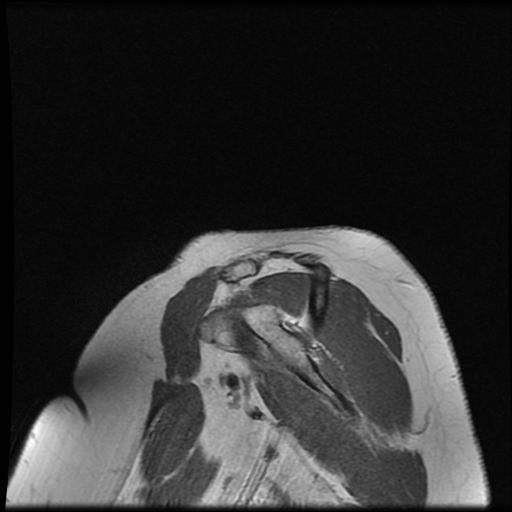

[Series 5: T1 · axial · 5.0mm · 0.47mm/px · 1 of 34 slices shown (3 of 3)]
[im 1/34]
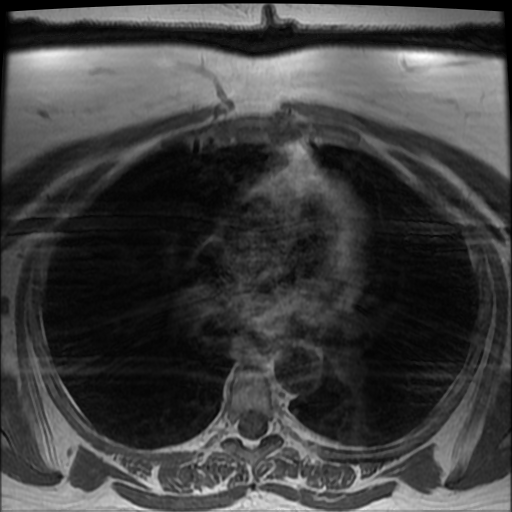

[Series 6: T1 fat-sat · axial · 5.0mm · 0.47mm/px · 1 of 34 slices shown]
[im 1/34]
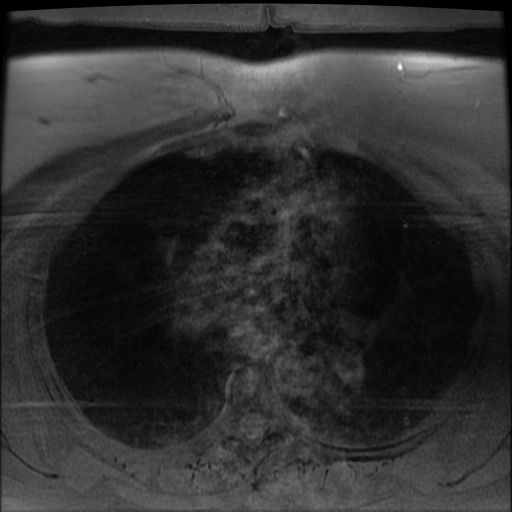

[Series 7: T2 fat-sat · coronal · 5.0mm · 0.47mm/px · 1 of 33 slices shown (1 of 2)]
[im 1/33]
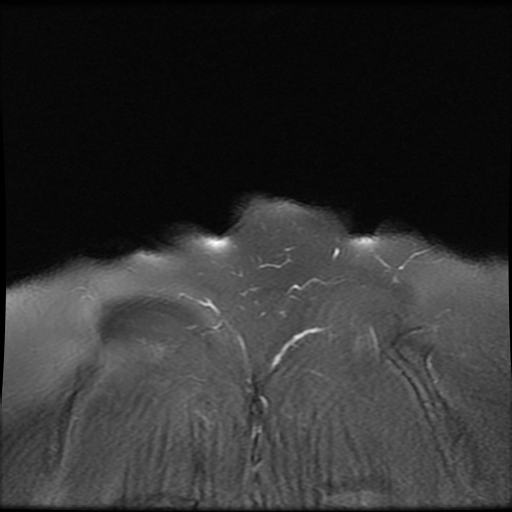

[Series 8: T2 fat-sat · sagittal · 5.0mm · 0.47mm/px · 2 of 45 slices shown (2 of 2)]
[im 1/45]
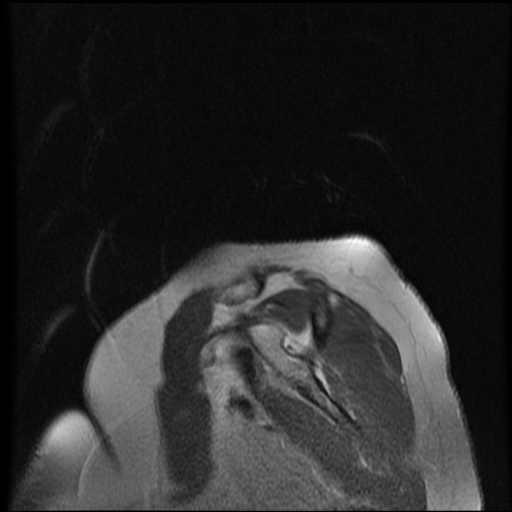
[im 45/45]
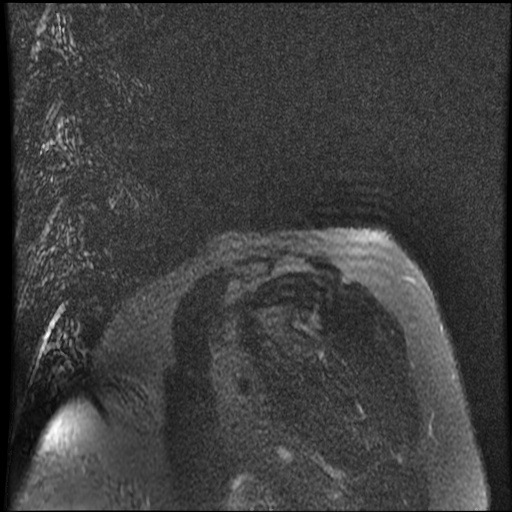

[Series 9: T2 · axial · 5.0mm · 0.47mm/px · 1 of 34 slices shown]
[im 1/34]
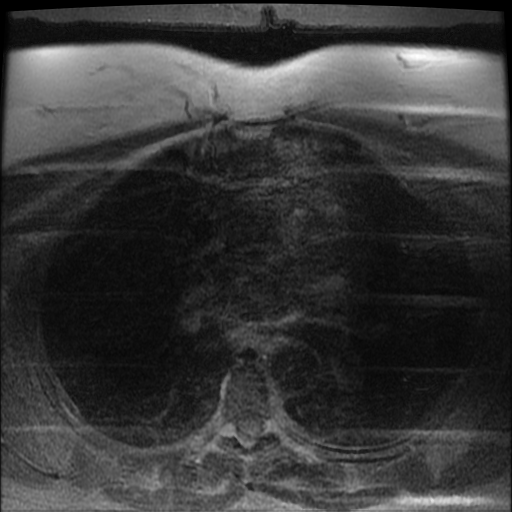

[Series 10: ****axial ir · axial · 5.0mm · 0.47mm/px · 1 of 34 slices shown]
[im 1/34]
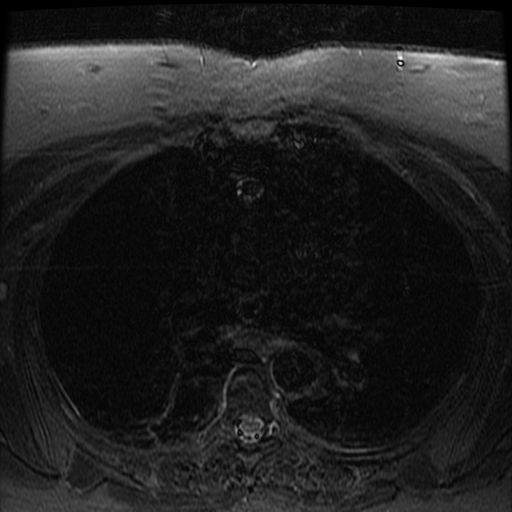

[Series 11: T1 fat-sat post-contrast · sagittal · 5.0mm · 0.47mm/px · 2 of 45 slices shown (1 of 2)]
[im 1/45]
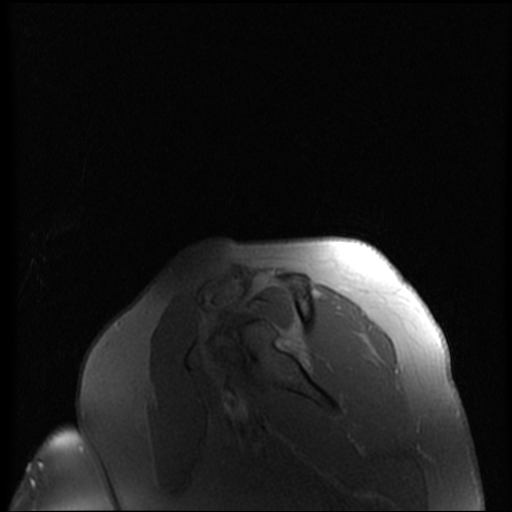
[im 45/45]
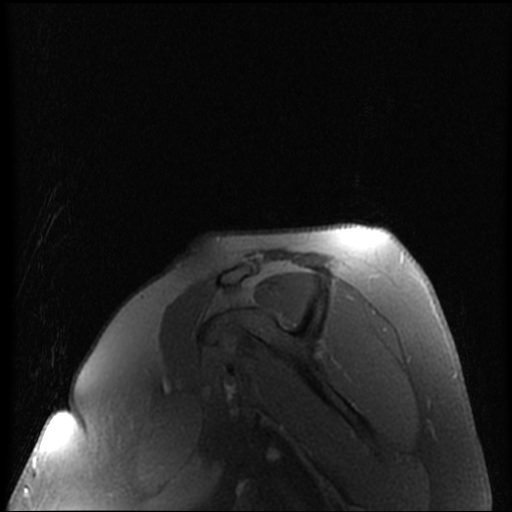

[Series 12: T1 post-contrast · axial · 5.0mm · 0.47mm/px · 1 of 34 slices shown]
[im 1/34]
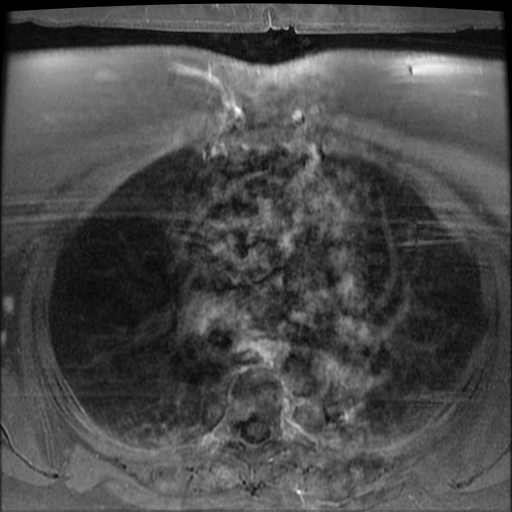

[Series 13: T1 fat-sat post-contrast · coronal · 5.0mm · 0.47mm/px · 1 of 33 slices shown (2 of 2)]
[im 1/33]
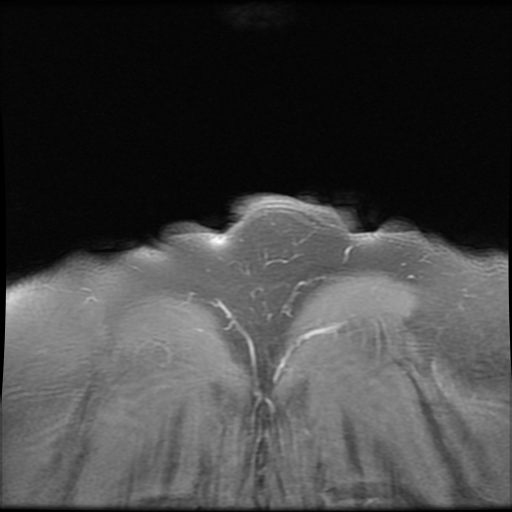

[16 of 16 positions shown; findings below may reference images not displayed]

FINDINGS: There is considerable motion degradation. Small or subtle lesions
could be overlooked. The neck CT was extended to include the upper
mediastinum 3 cm below the carina to look for ectopic parathyroid
tissue.

There are no dominant or enhancing lesions observed in the
mediastinum or neck suspicious for parathyroid adenoma. There are
some small level II lymph nodes in the neck, but no pathologic
adenopathy. Normal-appearing thyroid. Airway midline. No osseous
findings other than mild cervical spondylosis. Visualized lung
apices unremarkable.
IMPRESSION: No MR evidence for occult parathyroid adenoma on this motion
degraded scan. It is unclear if CT of the neck with and without
contrast according to parathyroid protocol would provide additional
information, given the previous negative surgical exploration,
negative sestamibi scan, as well as lack of apparent findings on
today's study.

## 2018-01-16 ENCOUNTER — Ambulatory Visit: Payer: Self-pay | Admitting: Internal Medicine

## 2018-01-16 DIAGNOSIS — Z0289 Encounter for other administrative examinations: Secondary | ICD-10-CM

## 2019-04-04 ENCOUNTER — Other Ambulatory Visit (HOSPITAL_BASED_OUTPATIENT_CLINIC_OR_DEPARTMENT_OTHER): Payer: BC Managed Care – PPO

## 2019-04-04 ENCOUNTER — Other Ambulatory Visit: Payer: Self-pay

## 2019-04-04 ENCOUNTER — Emergency Department (HOSPITAL_BASED_OUTPATIENT_CLINIC_OR_DEPARTMENT_OTHER)
Admission: EM | Admit: 2019-04-04 | Discharge: 2019-04-04 | Disposition: A | Discharge status: Home / Self Care | Payer: BC Managed Care – PPO | Attending: Emergency Medicine | Admitting: Emergency Medicine

## 2019-04-04 ENCOUNTER — Encounter (HOSPITAL_BASED_OUTPATIENT_CLINIC_OR_DEPARTMENT_OTHER): Payer: Self-pay | Admitting: *Deleted

## 2019-04-04 DIAGNOSIS — Z87891 Personal history of nicotine dependence: Secondary | ICD-10-CM | POA: Diagnosis not present

## 2019-04-04 DIAGNOSIS — Z86718 Personal history of other venous thrombosis and embolism: Secondary | ICD-10-CM | POA: Diagnosis not present

## 2019-04-04 DIAGNOSIS — I10 Essential (primary) hypertension: Secondary | ICD-10-CM | POA: Diagnosis not present

## 2019-04-04 DIAGNOSIS — M79605 Pain in left leg: Secondary | ICD-10-CM | POA: Insufficient documentation

## 2019-04-04 DIAGNOSIS — L539 Erythematous condition, unspecified: Secondary | ICD-10-CM | POA: Insufficient documentation

## 2019-04-04 DIAGNOSIS — M79662 Pain in left lower leg: Secondary | ICD-10-CM | POA: Diagnosis present

## 2019-04-04 DIAGNOSIS — Z8551 Personal history of malignant neoplasm of bladder: Secondary | ICD-10-CM | POA: Diagnosis not present

## 2019-04-04 DIAGNOSIS — Z8543 Personal history of malignant neoplasm of ovary: Secondary | ICD-10-CM | POA: Insufficient documentation

## 2019-04-04 HISTORY — DX: Malignant neoplasm of bladder, unspecified: C67.9

## 2019-04-04 MED ORDER — ENOXAPARIN SODIUM 150 MG/ML ~~LOC~~ SOLN
1.5000 mg/kg | Freq: Once | SUBCUTANEOUS | Status: AC
  Administered 2019-04-04: 135 mg via SUBCUTANEOUS
  Filled 2019-04-04: qty 1

## 2019-04-04 NOTE — ED Notes (Signed)
Outpatient ultrasound scheduled and pt voices understanding.

## 2019-04-04 NOTE — ED Provider Notes (Signed)
[facility] Provider Note   CSN: [id number] Arrival date & time: 04/04/19  1956  History   Chief Complaint Chief Complaint  Patient presents with  . Leg Pain   HPI [name] is a 63 y.o. female with past medical history significant for DVT who presents for evaluation of left calf pain.  Patient states she has noted left calf pain, redness over the last 2 days.  Consulted her PCP who told she need to be evaluated for a blood clot given her history.  She is not currently on any anticoagulation.  She denies recent injury or trauma.  Denies fever, chills, nausea, vomiting, chest pain, shortness of breath, hemoptysis, abdominal pain, decreased range of motion, numbness or tingling to her extremities.  Has not take anything for symptoms PTA.  She rates her current pain a 3/10.  Denies radiation of pain.  Denies additional aggravating or alleviating factors   History obtained from patient and past medical records.  No interpreter was used.     HPI  Past Medical History:  Diagnosis Date  . Anemia    "when I was little"  . Anxiety   . Arrhythmia    cardiac dysrhythmia, tachycardia  . Arthritis    "lower back" (01/31/2015)  . Bladder cancer ([place])   . Chest pain   . Complication of anesthesia    hard to wake up --  gallbladder here @ [facility]  . Depression   . DVT (deep venous thrombosis) ([place]) 01/2006   "legs; after my gallbladder OR"  . Fibromyalgia   . Gallstones   . GERD (gastroesophageal reflux disease)   . Headache    "weekly" (01/31/2015)  . Hx of echocardiogram    Echo (11/15):  Mild LVH, EF 55-60%, GLPSS mildly reduced (-16%), Gr 1 DD  . Hx of echocardiogram    Lexiscan Myoview (11/15):  Low risk; Fixed, small mild apical anterior and apical inferolateral perfusion defects. Suspect soft tissue attenuation given normal wall motion. No ischemia. EF 71%.  . Hyperglycemia   . Hyperlipidemia   . Hypertension   . Kidney anomaly, congenital     "faulty plumbing" per pt.  . Kidney stones   . Ovarian cancer ([place])    pre- cancerous- bil BSO done  . Parathyroid abnormality ([place])   . Pneumonia X 2  . PVD (peripheral vascular disease) ([place])   . Restless leg syndrome   . Shortness of breath dyspnea    on exertion- states she recovers her breath quickly upon rest    Patient Active Problem List   Diagnosis Date Noted  . Pelvic mass in female 05/16/2016  . H/O vitamin D deficiency 07/25/2015  . Primary hyperparathyroidism ([place]) 01/31/2015  . Osteopenia 12/20/2014  . Hypercalcemia 06/13/2014  . Mixed hyperlipidemia 11/30/2013  . Essential hypertension, benign 11/30/2013  . Fibromyalgia   . Depression   . Restless leg syndrome     Past Surgical History:  Procedure Laterality Date  . ABDOMINAL HYSTERECTOMY N/A 05/16/2016   Procedure: HYSTERECTOMY TOTAL  ABDOMINAL WITH PELVIC MASS REMOVAL ,INFRACOLIC OMENTECTOMY;  Surgeon: Janie Morning, MD;  Location: [facility];  Service: Gynecology;  Laterality: N/A;  . BILATERAL SALPINGOOPHORECTOMY Bilateral   . BREAST LUMPECTOMY Right    "took out golf-ball sized growth"  . CARDIAC CATHETERIZATION  2007  . CARPAL TUNNEL RELEASE Left   . CESAREAN SECTION  1976; 1977; 1980  . CHOLECYSTECTOMY  01/2006  . FRACTURE SURGERY    . LAPAROTOMY N/A 05/16/2016  Procedure: EXPLORATORY LAPAROTOMY;  Surgeon: Janie Morning, MD;  Location: [facility];  Service: Gynecology;  Laterality: N/A;  . NECK EXPLORATION  01/31/2015  . PARATHYROIDECTOMY  01/31/2015  . PARATHYROIDECTOMY N/A 01/31/2015   Procedure: NECK EXPLORATION WITH PARATHYROIDECTOMY;  Surgeon: Armandina Gemma, MD;  Location: [facility];  Service: General;  Laterality: N/A;  . TONSILLECTOMY    . TUBAL LIGATION  1980  . WRIST FRACTURE SURGERY Left 2014     OB History   No obstetric history on file.      Home Medications    Prior to Admission medications   Medication Sig Start Date End Date Taking? Authorizing Provider  aspirin EC 81 MG tablet Take 81 mg  by mouth daily.    [provider]  atenolol (TENORMIN) 25 MG tablet 1 tablet daily and an extra 1/2 tablet as needed for palpitations Patient taking differently: Take 25 mg by mouth daily.  11/30/13   Jettie Booze, MD  Cholecalciferol ([employer] VIT D 5000 HIGH-POTENCY) 5000 UNITS capsule Take 5,000 Units by mouth daily.    [provider]  dexlansoprazole (DEXILANT) 60 MG capsule Take 60 mg by mouth daily.    [provider]  enalapril (VASOTEC) 5 MG tablet 1 tablet daily and an extra tablet if systolic BP is higher than 150 Patient taking differently: Take 5 mg by mouth daily. 1 tablet daily and an extra tablet if systolic BP is higher than 150 11/30/13   Jettie Booze, MD  EPINEPHrine (EPIPEN 2-PAK) 0.3 mg/0.3 mL SOAJ injection Inject 0.3 mg into the muscle daily as needed (allergic reaction).     [provider]  escitalopram (LEXAPRO) 20 MG tablet Take 20 mg by mouth daily.  11/12/14   [provider]  ibuprofen (ADVIL,MOTRIN) 200 MG tablet Take 400-800 mg by mouth every 8 (eight) hours as needed for mild pain or moderate pain.    [provider]  ibuprofen (ADVIL,MOTRIN) 800 MG tablet  06/25/16   [provider]  nitroGLYCERIN (NITROSTAT) 0.4 MG SL tablet Place 0.4 mg under the tongue every 5 (five) minutes as needed for chest pain.    [provider]    Family History Family History  Problem Relation Age of Onset  . Hypertension Mother   . Hypothyroidism Mother   . Prostate cancer Father   . Heart attack Brother   . Heart attack Maternal Grandmother   . Heart attack Maternal Grandfather   . Heart attack Paternal Grandfather   . Stroke Neg Hx     Social History Social History   Tobacco Use  . Smoking status: Former Smoker    Years: 25.00    Types: Cigarettes  . Smokeless tobacco: Never Used  . Tobacco comment: "quit in smoking in ~ 2004"  Substance Use Topics  . Alcohol use: No  . Drug use: No      Allergies   Contrast media [iodinated diagnostic agents], Iohexol, and Bee venom   Review of Systems Review of Systems  Constitutional: Negative.   HENT: Negative.   Respiratory: Negative.   Cardiovascular: Negative.   Gastrointestinal: Negative.   Genitourinary: Negative.   Musculoskeletal:       Posterior calf pain and redness.  Skin: Negative.   Neurological: Negative.   All other systems reviewed and are negative.  Physical Exam Updated Vital Signs BP 112/70 (BP Location: Left Arm)   Pulse 76   Temp 98.9 F (37.2 C) (Oral)   Resp 17   Ht  5\' 2"  (1.575 m)   Wt 90.7 kg   SpO2 96%   BMI 36.58 kg/m   Physical Exam Vitals signs and nursing note reviewed.  Constitutional:      General: She is not in acute distress.    Appearance: She is not ill-appearing, toxic-appearing or diaphoretic.  HENT:     Head: Normocephalic and atraumatic.     Mouth/Throat:     Comments: Posterior oropharynx clear.  Mucous membranes moist.   Phonation normal. Neck:     Trachea: Trachea and phonation normal.  Cardiovascular:     Rate and Rhythm: Normal rate.     Pulses: Normal pulses.          Dorsalis pedis pulses are 2+ on the right side and 2+ on the left side.       Posterior tibial pulses are 2+ on the right side and 2+ on the left side.     Heart sounds: Normal heart sounds.     Comments: No murmurs rubs or gallops. Pulmonary:     Effort: Pulmonary effort is normal.     Breath sounds: Normal breath sounds and air entry.     Comments: Clear to auscultation bilaterally without wheeze, rhonchi or rales.  No accessory muscle usage.  Able speak in full sentences. Abdominal:     Comments: Soft, nontender without rebound or guarding.  No CVA tenderness.  Musculoskeletal:     Comments: Moves all 4 extremities without difficulty.  Tenderness palpation to left posterior calf.  There is area of erythema to proximal posterior calf.  No fluctuance or induration.  Full range of motion  bilateral lower extremities at difficulty.  Compartments soft.  Skin:    Comments: Brisk capillary refill.  Erythema to left posterior proximal calf.  No fluctuance or induration.  Brisk capillary refill.  Of abscess.  Neurological:     Mental Status: She is alert.     Comments: Ambulatory in department without difficulty.  Cranial nerves II through XII grossly intact.  No facial droop.  No aphasia. 5/5 strength bilateral lower extremity without difficulty.      ED Treatments / Results  Labs (all labs ordered are listed, but only abnormal results are displayed) Labs Reviewed - No data to display  EKG None  Radiology No results found.  Procedures Procedures (including critical care time)  Medications Ordered in ED Medications  enoxaparin (LOVENOX) injection 135 mg (135 mg Subcutaneous Given 04/04/19 2043)   Initial Impression / Assessment and Plan / ED Course  I have reviewed the triage vital signs and the nursing notes.  Pertinent labs & imaging results that were available during my care of the patient were reviewed by me and considered in my medical decision making (see chart for details).  63 year old female appears otherwise well presents for evaluation of left calf pain.  History DVT.  She is afebrile without tachycardia, tachypnea or hypoxia.  Denies chest pain, shortness of breath or hemoptysis.  Not currently on anticoagulation.  Patient with tenderness to palpation to left posterior calf with proximal posterior calf erythema without gross edema.  Bilateral lower extremity compartments soft.  Normal musculoskeletal exam.  She is neurovascularly intact.  Ambulatory in ED without difficulty.  No evidence of fluctuance or induration to suggest cellulitis or abscess as cause of her erythema.  No recent injury or trauma.  Unfortunately we do not have ultrasound present ED tonight.  Discussed with patient one-time dose of Lovenox.  Discussed risk versus benefit.  She  voiced  understanding would like dose of Lovenox.  Denies prior history of spontaneous brain bleed or adverse effects with anticoagulation. We will have radiology schedule outpatient follow-up for tomorrow morning.  Low  suspicion for PE as she is without chest pain, shortness of breath, tachycardic tachypnea or hypoxia.  Hemodynamically stable and appropriate for DC home at this time.  Discussed return precautions.  Patient voiced understanding and is agreeable to follow-up.     Final Clinical Impressions(s) / ED Diagnoses   Final diagnoses:  Left leg pain  History of DVT in adulthood    ED Discharge Orders    None       Shalena Ezzell A, PA-C 04/04/19 2112    Julianne Rice, MD 04/04/19 2313

## 2019-04-04 NOTE — ED Triage Notes (Signed)
Pt c/o posterior left calf pain with redness and swelling since yesterday. She has a hx of blood clots and is concerned this may be one

## 2019-04-05 ENCOUNTER — Other Ambulatory Visit (HOSPITAL_BASED_OUTPATIENT_CLINIC_OR_DEPARTMENT_OTHER): Payer: Self-pay | Admitting: Physician Assistant

## 2019-04-05 ENCOUNTER — Ambulatory Visit (HOSPITAL_BASED_OUTPATIENT_CLINIC_OR_DEPARTMENT_OTHER)
Admission: RE | Admit: 2019-04-05 | Discharge: 2019-04-05 | Disposition: A | Discharge status: Home / Self Care | Payer: BC Managed Care – PPO | Source: Ambulatory Visit | Attending: Physician Assistant | Admitting: Physician Assistant

## 2019-04-05 DIAGNOSIS — L819 Disorder of pigmentation, unspecified: Secondary | ICD-10-CM | POA: Insufficient documentation

## 2019-04-05 DIAGNOSIS — M7989 Other specified soft tissue disorders: Secondary | ICD-10-CM | POA: Diagnosis present

## 2019-04-05 NOTE — ED Provider Notes (Addendum)
Patient came back for DVT study which was positive.  Minor DVT in the left calf.  No proximal extension.  Patient has history of DVT, symptomatic with pain and will treat.  Has no contraindication to being on blood thinner.  Will prescribe Eliquis.  Given information to follow-up with hematology.  Discharged from ED in good condition.   Lennice Sites, DO 04/05/19 [place], [place], DO 04/05/19 1223

## 2019-04-19 ENCOUNTER — Telehealth: Payer: Self-pay | Admitting: Hematology

## 2019-04-19 NOTE — Telephone Encounter (Signed)
Received a staff msg to schedule Ms. [name] to see Dr. Burr Medico for DVT. Ms. [name] was seen in the hospital. She's been scheduled to see Dr. Burr Medico on 8/26 at 3pm. Aware to arrive 15 minutes early

## 2019-04-27 ENCOUNTER — Ambulatory Visit: Payer: BC Managed Care – PPO | Admitting: Hematology and Oncology

## 2019-05-04 ENCOUNTER — Telehealth: Payer: Self-pay

## 2019-05-04 NOTE — Telephone Encounter (Signed)
Spoke to patient has appointment tomorrow 8/26,  she has had a known COVID exposure is getting testing tomorrow.  I requested she call our office back once she received her test result and we will get her rescheduled. Message sent to Isaiah Blakes

## 2019-05-05 ENCOUNTER — Inpatient Hospital Stay: Payer: BC Managed Care – PPO | Admitting: Hematology

## 2019-07-22 ENCOUNTER — Encounter: Payer: Self-pay | Admitting: Hematology

## 2019-07-22 ENCOUNTER — Inpatient Hospital Stay: Payer: BC Managed Care – PPO | Attending: Hematology and Oncology | Admitting: Hematology

## 2019-07-22 ENCOUNTER — Other Ambulatory Visit: Payer: Self-pay

## 2019-07-22 ENCOUNTER — Inpatient Hospital Stay: Payer: BC Managed Care – PPO

## 2019-07-22 VITALS — BP 115/58 | HR 65 | Temp 98.7°F | Resp 17 | Ht 62.0 in | Wt 200.6 lb

## 2019-07-22 DIAGNOSIS — E785 Hyperlipidemia, unspecified: Secondary | ICD-10-CM | POA: Diagnosis not present

## 2019-07-22 DIAGNOSIS — I82403 Acute embolism and thrombosis of unspecified deep veins of lower extremity, bilateral: Secondary | ICD-10-CM

## 2019-07-22 DIAGNOSIS — G2581 Restless legs syndrome: Secondary | ICD-10-CM | POA: Insufficient documentation

## 2019-07-22 DIAGNOSIS — Z90722 Acquired absence of ovaries, bilateral: Secondary | ICD-10-CM | POA: Diagnosis not present

## 2019-07-22 DIAGNOSIS — Z8042 Family history of malignant neoplasm of prostate: Secondary | ICD-10-CM | POA: Diagnosis not present

## 2019-07-22 DIAGNOSIS — I10 Essential (primary) hypertension: Secondary | ICD-10-CM | POA: Insufficient documentation

## 2019-07-22 DIAGNOSIS — Z7982 Long term (current) use of aspirin: Secondary | ICD-10-CM | POA: Insufficient documentation

## 2019-07-22 DIAGNOSIS — Z79899 Other long term (current) drug therapy: Secondary | ICD-10-CM | POA: Insufficient documentation

## 2019-07-22 DIAGNOSIS — Z7901 Long term (current) use of anticoagulants: Secondary | ICD-10-CM | POA: Insufficient documentation

## 2019-07-22 DIAGNOSIS — I739 Peripheral vascular disease, unspecified: Secondary | ICD-10-CM | POA: Insufficient documentation

## 2019-07-22 DIAGNOSIS — Z87891 Personal history of nicotine dependence: Secondary | ICD-10-CM | POA: Insufficient documentation

## 2019-07-22 DIAGNOSIS — M797 Fibromyalgia: Secondary | ICD-10-CM | POA: Diagnosis not present

## 2019-07-22 DIAGNOSIS — Z8551 Personal history of malignant neoplasm of bladder: Secondary | ICD-10-CM | POA: Diagnosis not present

## 2019-07-22 DIAGNOSIS — D649 Anemia, unspecified: Secondary | ICD-10-CM | POA: Insufficient documentation

## 2019-07-22 DIAGNOSIS — K219 Gastro-esophageal reflux disease without esophagitis: Secondary | ICD-10-CM | POA: Insufficient documentation

## 2019-07-22 DIAGNOSIS — Z87442 Personal history of urinary calculi: Secondary | ICD-10-CM | POA: Diagnosis not present

## 2019-07-22 DIAGNOSIS — F418 Other specified anxiety disorders: Secondary | ICD-10-CM | POA: Insufficient documentation

## 2019-07-22 DIAGNOSIS — Z86718 Personal history of other venous thrombosis and embolism: Secondary | ICD-10-CM | POA: Insufficient documentation

## 2019-07-22 DIAGNOSIS — Z90710 Acquired absence of both cervix and uterus: Secondary | ICD-10-CM | POA: Diagnosis not present

## 2019-07-22 LAB — CMP (CANCER CENTER ONLY)
ALT: 22 U/L (ref 0–44)
AST: 15 U/L (ref 15–41)
Albumin: 4 g/dL (ref 3.5–5.0)
Alkaline Phosphatase: 135 U/L — ABNORMAL HIGH (ref 38–126)
Anion gap: 6 (ref 5–15)
BUN: 14 mg/dL (ref 6–20)
CO2: 25 mmol/L (ref 22–32)
Calcium: 10.6 mg/dL — ABNORMAL HIGH (ref 8.9–10.3)
Chloride: 109 mmol/L (ref 98–111)
Creatinine: 0.99 mg/dL (ref 0.44–1.00)
GFR, Est AFR Am: >60 mL/min (ref >60)
GFR, Estimated: >60 mL/min (ref >60)
Glucose, Bld: 99 mg/dL (ref 70–99)
Potassium: 4.9 mmol/L (ref 3.5–5.1)
Sodium: 140 mmol/L (ref 135–145)
Total Bilirubin: 0.3 mg/dL (ref 0.3–1.2)
Total Protein: 6.6 g/dL (ref 6.5–8.1)

## 2019-07-22 LAB — CBC WITH DIFFERENTIAL (CANCER CENTER ONLY)
Abs Immature Granulocytes: 0.01 10*3/uL (ref 0.00–0.07)
Basophils Absolute: 0.1 10*3/uL (ref 0.0–0.1)
Basophils Relative: 1 %
Eosinophils Absolute: 0.2 10*3/uL (ref 0.0–0.5)
Eosinophils Relative: 3 %
HCT: 45.2 % (ref 36.0–46.0)
Hemoglobin: 14.7 g/dL (ref 12.0–15.0)
Immature Granulocytes: 0 %
Lymphocytes Relative: 47 %
Lymphs Abs: 2.3 10*3/uL (ref 0.7–4.0)
MCH: 30.5 pg (ref 26.0–34.0)
MCHC: 32.5 g/dL (ref 30.0–36.0)
MCV: 93.8 fL (ref 80.0–100.0)
Monocytes Absolute: 0.4 10*3/uL (ref 0.1–1.0)
Monocytes Relative: 9 %
Neutro Abs: 2 10*3/uL (ref 1.7–7.7)
Neutrophils Relative %: 40 %
Platelet Count: 191 10*3/uL (ref 150–400)
RBC: 4.82 MIL/uL (ref 3.87–5.11)
RDW: 11.9 % (ref 11.5–15.5)
WBC Count: 4.9 10*3/uL (ref 4.0–10.5)
nRBC: 0 % (ref 0.0–0.2)

## 2019-07-22 LAB — ANTITHROMBIN III: AntiThromb III Func: 102 % (ref 75–120)

## 2019-07-22 MED ORDER — APIXABAN 5 MG PO TABS: 5.0000 mg | ORAL_TABLET | Freq: Two times a day (BID) | ORAL | 5 refills | Status: DC

## 2019-07-22 NOTE — Progress Notes (Signed)
[place]   [phone] [phone]   Clinic New Consult Note   Patient Care Team: Carol Ada, MD as PCP - General (Family Medicine)  Date of Service:  07/22/2019  CHIEF COMPLAINTS/PURPOSE OF CONSULTATION:  Recurrent DVT  REFERRING PHYSICIAN:  ED physician   HISTORY OF PRESENTING ILLNESS:  [name] 63 y.o. 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 The patient was referred by Hospitalist. The patient presents to the clinic today alone.  She had Gallbladder surgery in 11/2005. She notes a few days later she was found to have Left leg DVT. This recurred in the left leg again. She notes around time of hysterectomy in 2017 from benign mass she had right arm blood clots 203 days after surgery with swelling and pain. Her fourth DVT in right calf on 04/04/2019 this time unprovoked. She notes no surgery, procedures or long distance traveling. She is currently on Eliquis 2.5mg .  She saw her PCP with labs on 06/25/19. She had elevated calcium and normal kidney/liver functions.  She notes 1 maternal aunt has phlebitis and recurrent blood counts. She notes having a miscarriage after 5 months and has birthed 2 children.   Socially she is married. She is retired but works part time occasionally. She quit smoking in 2000 after smoking for 25 years 1-2ppd.   They have a PMHx of controlled HTN. She had pre-cancerous lesion of ovaries and underwent BSO in her late 30s/early 61s. She later had hysterectomy. She had parathyroid removed due to high calcium. She have a early stage slow growing Bladder cancer diagnosed in 2018. She did have resection by endoscopy and ECG. She is currently on Surveillance with cystoscopy yearly. Her maternal uncle had bladder cancer, her father had head and neck and prostate cancer and her grandmother had bladder cancer.     REVIEW OF SYSTEMS:   Constitutional: Denies fevers, chills or abnormal night sweats Eyes: Denies blurriness  of vision, double vision or watery eyes Ears, nose, mouth, throat, and face: Denies mucositis or sore throat Respiratory: Denies cough, dyspnea or wheezes Cardiovascular: Denies palpitation, chest discomfort or lower extremity swelling Gastrointestinal:  Denies nausea, heartburn or change in bowel habits Skin: Denies abnormal skin rashes Lymphatics: Denies new lymphadenopathy or easy bruising Neurological:Denies numbness, tingling or new weaknesses Behavioral/Psych: Mood is stable, no new changes  All other systems were reviewed with the patient and are negative.   MEDICAL HISTORY:  Past Medical History:  Diagnosis Date  . Anemia    "when I was little"  . Anxiety   . Arrhythmia    cardiac dysrhythmia, tachycardia  . Arthritis    "lower back" (01/31/2015)  . Bladder cancer ([place])   . Chest pain   . Complication of anesthesia    hard to wake up --  gallbladder here @ [facility]  . Depression   . DVT (deep venous thrombosis) ([place]) 01/2006   "legs; after my gallbladder OR"  . Fibromyalgia   . Gallstones   . GERD (gastroesophageal reflux disease)   . Headache    "weekly" (01/31/2015)  . Hx of echocardiogram    Echo (11/15):  Mild LVH, EF 55-60%, GLPSS mildly reduced (-16%), Gr 1 DD  . Hx of echocardiogram    Lexiscan Myoview (11/15):  Low risk; Fixed, small mild apical anterior and apical inferolateral perfusion defects. Suspect soft tissue attenuation given normal wall motion. No ischemia. EF 71%.  . Hyperglycemia   . Hyperlipidemia   . Hypertension   .  Kidney anomaly, congenital    "faulty plumbing" per pt.  . Kidney stones   . Ovarian cancer ([place])    pre- cancerous- bil BSO done  . Parathyroid abnormality ([place])   . Pneumonia X 2  . PVD (peripheral vascular disease) ([place])   . Restless leg syndrome   . Shortness of breath dyspnea    on exertion- states she recovers her breath quickly upon rest    SURGICAL HISTORY: Past Surgical History:  Procedure Laterality Date  .  ABDOMINAL HYSTERECTOMY N/A 05/16/2016   Procedure: HYSTERECTOMY TOTAL  ABDOMINAL WITH PELVIC MASS REMOVAL ,INFRACOLIC OMENTECTOMY;  Surgeon: Janie Morning, MD;  Location: [facility];  Service: Gynecology;  Laterality: N/A;  . BILATERAL SALPINGOOPHORECTOMY Bilateral   . BREAST LUMPECTOMY Right    "took out golf-ball sized growth"  . CARDIAC CATHETERIZATION  2007  . CARPAL TUNNEL RELEASE Left   . CESAREAN SECTION  1976; 1977; 1980  . CHOLECYSTECTOMY  01/2006  . FRACTURE SURGERY    . LAPAROTOMY N/A 05/16/2016   Procedure: EXPLORATORY LAPAROTOMY;  Surgeon: Janie Morning, MD;  Location: [facility];  Service: Gynecology;  Laterality: N/A;  . NECK EXPLORATION  01/31/2015  . PARATHYROIDECTOMY  01/31/2015  . PARATHYROIDECTOMY N/A 01/31/2015   Procedure: NECK EXPLORATION WITH PARATHYROIDECTOMY;  Surgeon: Armandina Gemma, MD;  Location: [facility];  Service: General;  Laterality: N/A;  . TONSILLECTOMY    . TUBAL LIGATION  1980  . WRIST FRACTURE SURGERY Left 2014    SOCIAL HISTORY: Social History   Socioeconomic History  . Marital status: Married    Spouse name: Not on file  . Number of children: Not on file  . Years of education: Not on file  . Highest education level: Not on file  Occupational History  . Occupation: retired   Scientific laboratory technician  . Financial resource strain: Not on file  . Food insecurity    Worry: Not on file    Inability: Not on file  . Transportation needs    Medical: Not on file    Non-medical: Not on file  Tobacco Use  . Smoking status: Former Smoker    Packs/day: 2.00    Years: 25.00    Pack years: 50.00    Types: Cigarettes    Quit date: 09/09/2002    Years since quitting: 16.8  . Smokeless tobacco: Never Used  . Tobacco comment: "quit in smoking in ~ 2004"  Substance and Sexual Activity  . Alcohol use: No  . Drug use: No  . Sexual activity: Yes  Lifestyle  . Physical activity    Days per week: Not on file    Minutes per session: Not on file  . Stress: Not on file   Relationships  . Social Herbalist on phone: Not on file    Gets together: Not on file    Attends religious service: Not on file    Active member of club or organization: Not on file    Attends meetings of clubs or organizations: Not on file    Relationship status: Not on file  . Intimate partner violence    Fear of current or ex partner: Not on file    Emotionally abused: Not on file    Physically abused: Not on file    Forced sexual activity: Not on file  Other Topics Concern  . Not on file  Social History Narrative  . Not on file    FAMILY HISTORY: Family History  Problem Relation Age of  Onset  . Hypertension Mother   . Hypothyroidism Mother   . Prostate cancer Father   . Cancer Father        throat cancer and skin cancer   . Heart attack Brother   . Heart attack Maternal Grandmother   . Heart attack Maternal Grandfather   . Heart attack Paternal Grandfather   . Cancer Maternal Uncle 60       kidney cancer   . Cancer Paternal Grandmother        kidney cancer   . Stroke Neg Hx     ALLERGIES:  is allergic to contrast media [iodinated diagnostic agents]; iohexol; and bee venom.  MEDICATIONS:  Current Outpatient Medications  Medication Sig Dispense Refill  . aspirin EC 81 MG tablet Take 81 mg by mouth daily.    Marland Kitchen atenolol (TENORMIN) 25 MG tablet 1 tablet daily and an extra 1/2 tablet as needed for palpitations (Patient taking differently: Take 25 mg by mouth daily. )    . Cholecalciferol ([employer] VIT D 5000 HIGH-POTENCY) 5000 UNITS capsule Take 5,000 Units by mouth daily.    Marland Kitchen dexlansoprazole (DEXILANT) 60 MG capsule Take 60 mg by mouth daily.    . enalapril (VASOTEC) 5 MG tablet 1 tablet daily and an extra tablet if systolic BP is higher than 150 (Patient taking differently: Take 5 mg by mouth daily. 1 tablet daily and an extra tablet if systolic BP is higher than 150)    . EPINEPHrine (EPIPEN 2-PAK) 0.3 mg/0.3 mL SOAJ injection Inject 0.3 mg into the muscle  daily as needed (allergic reaction).     Marland Kitchen escitalopram (LEXAPRO) 20 MG tablet Take 20 mg by mouth daily.     Marland Kitchen ibuprofen (ADVIL,MOTRIN) 200 MG tablet Take 400-800 mg by mouth every 8 (eight) hours as needed for mild pain or moderate pain.    Marland Kitchen ibuprofen (ADVIL,MOTRIN) 800 MG tablet     . nitroGLYCERIN (NITROSTAT) 0.4 MG SL tablet Place 0.4 mg under the tongue every 5 (five) minutes as needed for chest pain.    Marland Kitchen apixaban (ELIQUIS) 5 MG TABS tablet Take 1 tablet (5 mg total) by mouth 2 (two) times daily. 60 tablet 5   No current facility-administered medications for this visit.     PHYSICAL EXAMINATION: ECOG PERFORMANCE STATUS: 0 - Asymptomatic  Vitals:   07/22/19 1437  BP: (!) 115/58  Pulse: 65  Resp: 17  Temp: 98.7 F (37.1 C)  SpO2: 97%   Filed Weights   07/22/19 1437  Weight: 200 lb 9.6 oz (91 kg)    GENERAL:alert, no distress and comfortable SKIN: skin color, texture, turgor are normal, no rashes or significant lesions EYES: normal, Conjunctiva are pink and non-injected, sclera clear  NECK: supple, thyroid normal size, non-tender, without nodularity LYMPH:  no palpable lymphadenopathy in the cervical, axillary  LUNGS: clear to auscultation and percussion with normal breathing effort HEART: regular rate & rhythm and no murmurs and no lower extremity edema ABDOMEN:abdomen soft, non-tender and normal bowel sounds Musculoskeletal:no cyanosis of digits and no clubbing  NEURO: alert & oriented x 3 with fluent speech, no focal motor/sensory deficits  LABORATORY DATA:  I have reviewed the data as listed Recent Results (from the past 2160 hour(s))  CMP ([facility] only)     Status: Abnormal   Collection Time: 07/22/19  3:28 PM  Result Value Ref Range   Sodium 140 135 - 145 mmol/L   Potassium 4.9 3.5 - 5.1 mmol/L   Chloride 109 98 -  111 mmol/L   CO2 25 22 - 32 mmol/L   Glucose, Bld 99 70 - 99 mg/dL   BUN 14 6 - 20 mg/dL   Creatinine 0.99 0.44 - 1.00 mg/dL   Calcium  10.6 (H) 8.9 - 10.3 mg/dL   Total Protein 6.6 6.5 - 8.1 g/dL   Albumin 4.0 3.5 - 5.0 g/dL   AST 15 15 - 41 U/L   ALT 22 0 - 44 U/L   Alkaline Phosphatase 135 (H) 38 - 126 U/L   Total Bilirubin 0.3 0.3 - 1.2 mg/dL   GFR, Est Non Af Am >60 >60 mL/min   GFR, Est AFR Am >60 >60 mL/min   Anion gap 6 5 - 15    Comment: Performed at [facility], [place] [address], [place], [place] [address]  CBC with Differential ([place] Only)     Status: None   Collection Time: 07/22/19  3:28 PM  Result Value Ref Range   WBC Count 4.9 4.0 - 10.5 K/uL   RBC 4.82 3.87 - 5.11 MIL/uL   Hemoglobin 14.7 12.0 - 15.0 g/dL   HCT 45.2 36.0 - 46.0 %   MCV 93.8 80.0 - 100.0 fL   MCH 30.5 26.0 - 34.0 pg   MCHC 32.5 30.0 - 36.0 g/dL   RDW 11.9 11.5 - 15.5 %   Platelet Count 191 150 - 400 K/uL   nRBC 0.0 0.0 - 0.2 %   Neutrophils Relative % 40 %   Neutro Abs 2.0 1.7 - 7.7 K/uL   Lymphocytes Relative 47 %   Lymphs Abs 2.3 0.7 - 4.0 K/uL   Monocytes Relative 9 %   Monocytes Absolute 0.4 0.1 - 1.0 K/uL   Eosinophils Relative 3 %   Eosinophils Absolute 0.2 0.0 - 0.5 K/uL   Basophils Relative 1 %   Basophils Absolute 0.1 0.0 - 0.1 K/uL   Immature Granulocytes 0 %   Abs Immature Granulocytes 0.01 0.00 - 0.07 K/uL    Comment: Performed at [facility], [place] [address], [place], [place] [address]  Antithrombin III     Status: None   Collection Time: 07/22/19  3:30 PM  Result Value Ref Range   AntiThromb III Func 102 75 - 120 %    Comment: Performed at [place] [address], [place], [place] [address]    RADIOGRAPHIC STUDIES: I have personally reviewed the radiological images as listed and agreed with the findings in the report. No results found.  ASSESSMENT:  [name] is a 63 y.o. caucasian female with a history of Arrhythmia, arthritis, H/o Bladder cancer, Depression/anxiety, H/o DVT, Fibromyalgia, GERD, HLD, HTN.    1. Recurrent DVT -I  reviewed with the patient about the plan for care for recurrent DVT. -Her initial Left leg DVT occurred a few days after her Gallbladder surgery in 11/2005. She had recurrence DVT in same leg years later, unprovoked.  -She notes around time of hysterectomy in 2017 she had right arm blood clots 2-3 days after surgery. -Her fourth and latest DVT in right calf on 04/04/2019 was unprovoked. She notes no surgery, procedures or long distance traveling around this time.  -She is currently on Eliquis 2.5mg  BID. No episodes of bleeding, tolerating well.  -I discussed given multiple DVT recurrence and 2 of which are unprovoked, I highly recommend she stay on Anticoagulation indefinitely. I discussed other anticoagulation therapies. She opted to continue Eliquis, I will increase to full  dose at 5mg  BID. Will hold around time of surgeries or invasive procedures. Will watch for bleeding and bruising.  -She had 1 miscarriage after 5 months and a maternal aunt with recurrent blood clots. I discussed genetic testing for possible thrombophilia disorders. She is agreeable.  -Will monitor labs at least every 6 months.  -I reinforced the importance of preventive strategies such as avoiding hormonal supplement, avoiding cigarette smoking, keeping up-to-date with screening programs for early cancer detection, frequent ambulation for long distance travel and aggressive DVT prophylaxis in all surgical settings. -She had a history of early stage bladder cancer, will follow up with urology.  I reinforced the importance of cancer screening, such as mammogram, colonoscopy and Pap smear. She is very compliant.  -F/u in 1 year and will likely discharge at that time if stable.    2. Bladder Cancer, Genetics, Cancer screenings -She has early stage bladder cancer diagnosed in 2018. She did have resection by endoscopy and ECG. She is currently on Surveillance with cystoscopy yearly.  -She is followed by [facility]. Nevada Cr[facility] at [facility]  -She also had  pre-cancerous mass on ovaries. She underwent BSO and later had hysterectomy due to benign mass.  -2 close relatives had kidney cancer. She is eligible for genetic testing. She is interested.  -I discussed the risk of blood clots increase with cancer. I encouraged her to continue age appropriate cancer screenings. She is s/p BSO and hysterectomy.    3. Hypercalcemia, Tachycardia and HTN -She is s/p parathyroidectomy  -Continue medications.  -Will continue to f/u with PCP and Cardiologist    PLAN:  -Send genetic referral if she qualifies  -Increase Eliquis to 5mg  BID (full dose anticoagulation), I called in today.  I recommend lifelong anticoagulation -Lab and f/u in 1 year, f/u as needed after next visit    Orders Placed This Encounter  Procedures  . CBC with Differential (Cancer Center Only)    Standing Status:   Future    Number of Occurrences:   1    Standing Expiration Date:   07/21/2020  . CMP ([place] only)    Standing Status:   Future    Number of Occurrences:   1    Standing Expiration Date:   07/21/2020  . Prothrombin gene mutation*    Standing Status:   Future    Number of Occurrences:   1    Standing Expiration Date:   07/21/2020  . Beta-2-glycoprotein i abs, IgG/M/A    Standing Status:   Future    Number of Occurrences:   1    Standing Expiration Date:   07/21/2020  . Cardiolipin antibodies, IgG, IgM, IgA*    Standing Status:   Future    Number of Occurrences:   1    Standing Expiration Date:   07/21/2020  . Lupus anticoagulant panel*    Standing Status:   Future    Number of Occurrences:   1    Standing Expiration Date:   07/21/2020  . Factor 5 [place]iden*    Standing Status:   Future    Number of Occurrences:   1    Standing Expiration Date:   07/21/2020    All questions were answered. The patient knows to call the clinic with any problems, questions or concerns. I spent 30 minutes counseling the patient face to face. The total time spent in the  appointment was 45 minutes and more than 50% was on counseling.     Truitt Merle, MD 07/22/2019  Oneal Deputy, am acting as scribe for Truitt Merle, MD.   I have reviewed the above documentation for accuracy and completeness, and I agree with the above.

## 2019-07-23 ENCOUNTER — Telehealth: Payer: Self-pay | Admitting: Hematology

## 2019-07-23 NOTE — Telephone Encounter (Signed)
Scheduled appt per 11/12 los. ° °Left a VM of the appt date and time. ° ° °

## 2019-07-24 LAB — LUPUS ANTICOAGULANT PANEL
DRVVT: 42.1 s (ref 0.0–47.0)
PTT Lupus Anticoagulant: 32.9 s (ref 0.0–51.9)

## 2019-07-24 LAB — CARDIOLIPIN ANTIBODIES, IGG, IGM, IGA
Anticardiolipin IgA: <9 APL U/mL (ref 0–11)
Anticardiolipin IgG: <9 GPL U/mL (ref 0–14)
Anticardiolipin IgM: 9 MPL U/mL (ref 0–12)

## 2019-07-25 LAB — BETA-2-GLYCOPROTEIN I ABS, IGG/M/A
Beta-2 Glyco I IgG: <9 GPI IgG units (ref 0–20)
Beta-2-Glycoprotein I IgA: <9 GPI IgA units (ref 0–25)
Beta-2-Glycoprotein I IgM: <9 GPI IgM units (ref 0–32)

## 2019-07-27 LAB — FACTOR 5 LEIDEN

## 2019-07-30 LAB — PROTHROMBIN GENE MUTATION

## 2019-08-04 ENCOUNTER — Telehealth: Payer: Self-pay | Admitting: Hematology

## 2019-08-04 NOTE — Telephone Encounter (Signed)
Scheduled appt per 11/25 sch message - pt is aware of appt date and time   

## 2019-08-06 ENCOUNTER — Telehealth: Payer: Self-pay

## 2019-08-06 NOTE — Telephone Encounter (Signed)
Patient is calling wanting her lab results explained.  She is able to view them in My Chart.    [phone]

## 2019-08-09 ENCOUNTER — Inpatient Hospital Stay (HOSPITAL_BASED_OUTPATIENT_CLINIC_OR_DEPARTMENT_OTHER): Payer: BC Managed Care – PPO | Admitting: Hematology

## 2019-08-09 ENCOUNTER — Encounter: Payer: Self-pay | Admitting: Hematology

## 2019-08-09 DIAGNOSIS — I82409 Acute embolism and thrombosis of unspecified deep veins of unspecified lower extremity: Secondary | ICD-10-CM | POA: Diagnosis not present

## 2019-08-09 DIAGNOSIS — D6851 Activated protein C resistance: Secondary | ICD-10-CM

## 2019-08-09 NOTE — Progress Notes (Signed)
[place]   Telephone:(336) [phone] Fax:(336) [phone]   Clinic Follow up Note   Patient Care Team: Carol Ada, MD as PCP - General (Family Medicine)   I connected with Glo Herring on 08/09/2019 at  1:20 PM EST by telephone visit and verified that I am speaking with the correct person using two identifiers.  I discussed the limitations, risks, security and privacy concerns of performing an evaluation and management service by telephone and the availability of in person appointments. I also discussed with the patient that there may be a patient responsible charge related to this service. The patient expressed understanding and agreed to proceed.   Patient's location:  Her home  Provider's location:  My Office  CHIEF COMPLAINT: F/u of Recurrent DVT   CURRENT THERAPY:  Eliquis 5mg  BID   INTERVAL HISTORY:  [name] is here for a follow up of recurrent DVT. They identified themselves by birth date video.   REVIEW OF SYSTEMS:   Constitutional: Denies fevers, chills or abnormal weight loss Eyes: Denies blurriness of vision Ears, nose, mouth, throat, and face: Denies mucositis or sore throat Respiratory: Denies cough, dyspnea or wheezes Cardiovascular: Denies palpitation, chest discomfort or lower extremity swelling Gastrointestinal:  Denies nausea, heartburn or change in bowel habits Skin: Denies abnormal skin rashes Lymphatics: Denies new lymphadenopathy or easy bruising Neurological:Denies numbness, tingling or new weaknesses Behavioral/Psych: Mood is stable, no new changes  All other systems were reviewed with the patient and are negative.  MEDICAL HISTORY:  Past Medical History:  Diagnosis Date  . Anemia    "when I was little"  . Anxiety   . Arrhythmia    cardiac dysrhythmia, tachycardia  . Arthritis    "lower back" (01/31/2015)  . Bladder cancer ([place])   . Chest pain   . Complication of anesthesia    hard to wake up --  gallbladder here @  [facility]  . Depression   . DVT (deep venous thrombosis) ([place]) 01/2006   "legs; after my gallbladder OR"  . Fibromyalgia   . Gallstones   . GERD (gastroesophageal reflux disease)   . Headache    "weekly" (01/31/2015)  . Hx of echocardiogram    Echo (11/15):  Mild LVH, EF 55-60%, GLPSS mildly reduced (-16%), Gr 1 DD  . Hx of echocardiogram    Lexiscan Myoview (11/15):  Low risk; Fixed, small mild apical anterior and apical inferolateral perfusion defects. Suspect soft tissue attenuation given normal wall motion. No ischemia. EF 71%.  . Hyperglycemia   . Hyperlipidemia   . Hypertension   . Kidney anomaly, congenital    "faulty plumbing" per pt.  . Kidney stones   . Ovarian cancer ([place])    pre- cancerous- bil BSO done  . Parathyroid abnormality ([place])   . Pneumonia X 2  . PVD (peripheral vascular disease) ([place])   . Restless leg syndrome   . Shortness of breath dyspnea    on exertion- states she recovers her breath quickly upon rest    SURGICAL HISTORY: Past Surgical History:  Procedure Laterality Date  . ABDOMINAL HYSTERECTOMY N/A 05/16/2016   Procedure: HYSTERECTOMY TOTAL  ABDOMINAL WITH PELVIC MASS REMOVAL ,INFRACOLIC OMENTECTOMY;  Surgeon: Janie Morning, MD;  Location: [facility];  Service: Gynecology;  Laterality: N/A;  . BILATERAL SALPINGOOPHORECTOMY Bilateral   . BREAST LUMPECTOMY Right    "took out golf-ball sized growth"  . CARDIAC CATHETERIZATION  2007  . CARPAL TUNNEL RELEASE Left   . [place];  1977; 1980  . CHOLECYSTECTOMY  01/2006  . FRACTURE SURGERY    . LAPAROTOMY N/A 05/16/2016   Procedure: EXPLORATORY LAPAROTOMY;  Surgeon: Janie Morning, MD;  Location: [facility];  Service: Gynecology;  Laterality: N/A;  . NECK EXPLORATION  01/31/2015  . PARATHYROIDECTOMY  01/31/2015  . PARATHYROIDECTOMY N/A 01/31/2015   Procedure: NECK EXPLORATION WITH PARATHYROIDECTOMY;  Surgeon: Armandina Gemma, MD;  Location: [place];  Service: General;  Laterality: N/A;  . TONSILLECTOMY    .  TUBAL LIGATION  1980  . WRIST FRACTURE SURGERY Left 2014    I have reviewed the social history and family history with the patient and they are unchanged from previous note.  ALLERGIES:  is allergic to contrast media [iodinated diagnostic agents]; iohexol; and bee venom.  MEDICATIONS:  Current Outpatient Medications  Medication Sig Dispense Refill  . apixaban (ELIQUIS) 5 MG TABS tablet Take 1 tablet (5 mg total) by mouth 2 (two) times daily. 60 tablet 5  . aspirin EC 81 MG tablet Take 81 mg by mouth daily.    Marland Kitchen atenolol (TENORMIN) 25 MG tablet 1 tablet daily and an extra 1/2 tablet as needed for palpitations (Patient taking differently: Take 25 mg by mouth daily. )    . Cholecalciferol ([employer]S VIT D 5000 HIGH-POTENCY) 5000 UNITS capsule Take 5,000 Units by mouth daily.    Marland Kitchen dexlansoprazole (DEXILANT) 60 MG capsule Take 60 mg by mouth daily.    . enalapril (VASOTEC) 5 MG tablet 1 tablet daily and an extra tablet if systolic BP is higher than 150 (Patient taking differently: Take 5 mg by mouth daily. 1 tablet daily and an extra tablet if systolic BP is higher than 150)    . EPINEPHrine (EPIPEN 2-PAK) 0.3 mg/0.3 mL SOAJ injection Inject 0.3 mg into the muscle daily as needed (allergic reaction).     Marland Kitchen escitalopram (LEXAPRO) 20 MG tablet Take 20 mg by mouth daily.     Marland Kitchen ibuprofen (ADVIL,MOTRIN) 200 MG tablet Take 400-800 mg by mouth every 8 (eight) hours as needed for mild pain or moderate pain.    Marland Kitchen ibuprofen (ADVIL,MOTRIN) 800 MG tablet     . nitroGLYCERIN (NITROSTAT) 0.4 MG SL tablet Place 0.4 mg under the tongue every 5 (five) minutes as needed for chest pain.     No current facility-administered medications for this visit.     PHYSICAL EXAMINATION: ECOG PERFORMANCE STATUS: 0 - Asymptomatic  No vitals taken today, Exam not performed today   LABORATORY DATA:  I have reviewed the data as listed CBC Latest Ref Rng & Units 07/22/2019 05/17/2016 05/14/2016  WBC 4.0 - 10.5 K/uL 4.9 11.1(H) 5.1   Hemoglobin 12.0 - 15.0 g/dL 14.7 13.5 14.5  Hematocrit 36.0 - 46.0 % 45.2 39.7 42.5  Platelets 150 - 400 K/uL 191 188 193     CMP Latest Ref Rng & Units 07/22/2019 07/02/2016 05/17/2016  Glucose 70 - 99 mg/dL 99 - 123(H)  BUN 6 - 20 mg/dL 14 - 11  Creatinine 0.44 - 1.00 mg/dL 0.99 - 1.03(H)  Sodium 135 - 145 mmol/L 140 - 137  Potassium 3.5 - 5.1 mmol/L 4.9 - 4.9  Chloride 98 - 111 mmol/L 109 - 106  CO2 22 - 32 mmol/L 25 - 27  Calcium 8.9 - 10.3 mg/dL 10.6(H) 10.2 10.5(H)  Total Protein 6.5 - 8.1 g/dL 6.6 - -  Total Bilirubin 0.3 - 1.2 mg/dL 0.3 - -  Alkaline Phos 38 - 126 U/L 135(H) - -  AST 15 -  41 U/L 15 - -  ALT 0 - 44 U/L 22 - -    F5LEID:   Comment: (NOTE)  RESULT: SINGLE R506Q MUTATION IDENTIFIED (HETEROZYGOTE)  Factor V Leiden is a specific mutation (R506Q) in the factor V gene  that is associated with an increased risk of venous thrombosis.       RADIOGRAPHIC STUDIES: I have personally reviewed the radiological images as listed and agreed with the findings in the report. No results found.   ASSESSMENT & PLAN:  [name] is a 63 y.o. female with   1. Recurrent DVT, Factor 5 Leiden mutation (+) Herterozygote  -I reviewed with the patient about the plan for care for recurrent DVT. -Her initial Left leg DVT occurred a few days after her Gallbladder surgery in 11/2005. She had recurrence DVT in same leg years later, unprovoked.  -She notes around time of hysterectomy in 2017 she had right arm blood clots 2-3 days after surgery. -Her fourth and latest DVT in right calf on 04/04/2019 was unprovoked. She notes no surgery, procedures or long distance traveling around this time.  -She is currently on Eliquis 5mg  BID. No episodes of bleeding, tolerating well. I previously reocmmended she continue indefinitely.  -I reviewed her lab work up and genetic testing results, which was negative except she was found to be Factor 5 Leiden mutation positive (herterozygote) which is  high risk for blood clots. Her risk of thrombosis is 4-8 times higher than average population. Again she will be on lifelong anticoagulation to prevent future thrombosis   -I encourage her daughter and son to be tested for factor V Leiden mutation, they have 40% chance to be positive. -I reinforced the importance of preventive strategies such as avoiding hormonal supplement, avoiding cigarette smoking, keeping up-to-date with screening programs for early cancer detection, frequent ambulation for long distance travel and aggressive DVT prophylaxis in all surgical settings. -Continue Eliquis  -F/u in 1 year    2. Bladder Cancer, Genetics, Cancer screenings -She has early stage bladder cancer diagnosed in 2018. She did have resection by endoscopy and ECG. She is currently on Surveillance with cystoscopy yearly.  -She is followed by Dr. Nevada Crane at [facility]  -She also had pre-cancerous mass on ovaries. She underwent BSO and later had hysterectomy due to benign mass.  -2 close relatives had kidney cancer. -I discussed the risk of blood clots increase with cancer. I encouraged her to continue age appropriate cancer screenings. She is s/p BSO and hysterectomy.    3. Hypercalcemia, Tachycardia and HTN -She is s/p parathyroidectomy  -Continue medications.  -Will continue to f/u with PCP and Cardiologist    PLAN:  -We reviewed her genetic testing  -she will continue Eliquis -I encouraged her children to be tested for factor V Leiden mutation -Lab and f/u in 1 year    No problem-specific Assessment & Plan notes found for this encounter.   No orders of the defined types were placed in this encounter.  I discussed the assessment and treatment plan with the patient. The patient was provided an opportunity to ask questions and all were answered. The patient agreed with the plan and demonstrated an understanding of the instructions.  The patient was advised to call back or seek an in-person  evaluation if the symptoms worsen or if the condition fails to improve as anticipated.  I provided 15 minutes of non face-to-face telephone visit time during this encounter, and > 50% was spent counseling as documented under my assessment &  plan.    Truitt Merle, MD 08/09/2019   I, Joslyn Devon, am acting as scribe for Truitt Merle, MD.   I have reviewed the above documentation for accuracy and completeness, and I agree with the above.

## 2019-08-10 ENCOUNTER — Telehealth: Payer: Self-pay | Admitting: Hematology

## 2019-08-10 NOTE — Telephone Encounter (Signed)
No los per 11/30. °

## 2019-08-17 ENCOUNTER — Other Ambulatory Visit: Payer: Self-pay | Admitting: Physician Assistant

## 2019-08-17 DIAGNOSIS — Z1231 Encounter for screening mammogram for malignant neoplasm of breast: Secondary | ICD-10-CM

## 2019-08-18 ENCOUNTER — Other Ambulatory Visit: Payer: Self-pay | Admitting: Physician Assistant

## 2019-08-18 ENCOUNTER — Ambulatory Visit
Admission: RE | Admit: 2019-08-18 | Discharge: 2019-08-18 | Disposition: A | Discharge status: Home / Self Care | Payer: BC Managed Care – PPO | Source: Ambulatory Visit | Attending: Physician Assistant | Admitting: Physician Assistant

## 2019-08-18 ENCOUNTER — Other Ambulatory Visit: Payer: Self-pay

## 2019-08-18 DIAGNOSIS — Z1231 Encounter for screening mammogram for malignant neoplasm of breast: Secondary | ICD-10-CM

## 2019-08-18 DIAGNOSIS — E2839 Other primary ovarian failure: Secondary | ICD-10-CM

## 2019-08-19 ENCOUNTER — Other Ambulatory Visit: Payer: Self-pay

## 2019-08-19 DIAGNOSIS — R928 Other abnormal and inconclusive findings on diagnostic imaging of breast: Secondary | ICD-10-CM

## 2019-08-23 ENCOUNTER — Ambulatory Visit
Admission: RE | Admit: 2019-08-23 | Discharge: 2019-08-23 | Disposition: A | Discharge status: Home / Self Care | Payer: BC Managed Care – PPO | Source: Ambulatory Visit | Attending: Physician Assistant | Admitting: Physician Assistant

## 2019-08-23 ENCOUNTER — Other Ambulatory Visit: Payer: Self-pay

## 2019-08-23 DIAGNOSIS — E2839 Other primary ovarian failure: Secondary | ICD-10-CM

## 2019-08-25 ENCOUNTER — Other Ambulatory Visit: Payer: Self-pay

## 2019-08-25 ENCOUNTER — Ambulatory Visit
Admission: RE | Admit: 2019-08-25 | Discharge: 2019-08-25 | Disposition: A | Discharge status: Home / Self Care | Payer: BC Managed Care – PPO | Source: Ambulatory Visit | Attending: Physician Assistant | Admitting: Physician Assistant

## 2019-08-25 DIAGNOSIS — R928 Other abnormal and inconclusive findings on diagnostic imaging of breast: Secondary | ICD-10-CM

## 2019-08-25 DIAGNOSIS — N6310 Unspecified lump in the right breast, unspecified quadrant: Secondary | ICD-10-CM

## 2020-02-22 ENCOUNTER — Other Ambulatory Visit: Payer: Self-pay | Admitting: Family Medicine

## 2020-02-22 DIAGNOSIS — N6310 Unspecified lump in the right breast, unspecified quadrant: Secondary | ICD-10-CM

## 2020-02-24 ENCOUNTER — Other Ambulatory Visit: Payer: BC Managed Care – PPO

## 2020-03-29 ENCOUNTER — Other Ambulatory Visit: Payer: Self-pay | Admitting: Family Medicine

## 2020-03-29 ENCOUNTER — Ambulatory Visit
Admission: RE | Admit: 2020-03-29 | Discharge: 2020-03-29 | Disposition: A | Discharge status: Home / Self Care | Payer: BC Managed Care – PPO | Source: Ambulatory Visit | Attending: Family Medicine | Admitting: Family Medicine

## 2020-03-29 ENCOUNTER — Other Ambulatory Visit: Payer: Self-pay

## 2020-03-29 DIAGNOSIS — N6310 Unspecified lump in the right breast, unspecified quadrant: Secondary | ICD-10-CM

## 2020-07-19 NOTE — Progress Notes (Signed)
[place]   Telephone:(336) [phone] Fax:(336) [phone]   Clinic Follow up Note   Patient Care Team: Carol Ada, MD as PCP - General (Family Medicine)  Date of Service:  07/21/2020  CHIEF COMPLAINT: F/u of Recurrent DVT, Factor 5 Leiden mutation (+)   CURRENT THERAPY:  Eliquis 5mg  BID indefinitely   INTERVAL HISTORY:  [name] is here for a follow up DVT and Factor 5 Leiden. She was last seen by me 1 year ago. She presents to the clinic alone. She notes she is doing well. She denies any new major changes in the past year. She notes easy bruising form being on Eliquis.    REVIEW OF SYSTEMS:   Constitutional: Denies fevers, chills or abnormal weight loss Eyes: Denies blurriness of vision Ears, nose, mouth, throat, and face: Denies mucositis or sore throat Respiratory: Denies cough, dyspnea or wheezes Cardiovascular: Denies palpitation, chest discomfort or lower extremity swelling Gastrointestinal:  Denies nausea, heartburn or change in bowel habits Skin: Denies abnormal skin rashes Lymphatics: Denies new lymphadenopathy (+) easy bruising Neurological:Denies numbness, tingling or new weaknesses Behavioral/Psych: Mood is stable, no new changes  All other systems were reviewed with the patient and are negative.  MEDICAL HISTORY:  Past Medical History:  Diagnosis Date  . Anemia    "when I was little"  . Anxiety   . Arrhythmia    cardiac dysrhythmia, tachycardia  . Arthritis    "lower back" (01/31/2015)  . Bladder cancer ([place])   . Chest pain   . Complication of anesthesia    hard to wake up --  gallbladder here @ [facility]  . Depression   . DVT (deep venous thrombosis) ([place]) 01/2006   "legs; after my gallbladder OR"  . Fibromyalgia   . Gallstones   . GERD (gastroesophageal reflux disease)   . Headache    "weekly" (01/31/2015)  . Hx of echocardiogram    Echo (11/15):  Mild LVH, EF 55-60%, GLPSS mildly reduced (-16%), Gr 1 DD  . Hx of echocardiogram     Lexiscan Myoview (11/15):  Low risk; Fixed, small mild apical anterior and apical inferolateral perfusion defects. Suspect soft tissue attenuation given normal wall motion. No ischemia. EF 71%.  . Hyperglycemia   . Hyperlipidemia   . Hypertension   . Kidney anomaly, congenital    "faulty plumbing" per pt.  . Kidney stones   . Ovarian cancer ([place])    pre- cancerous- bil BSO done  . Parathyroid abnormality ([place])   . Pneumonia X 2  . PVD (peripheral vascular disease) ([place])   . Restless leg syndrome   . Shortness of breath dyspnea    on exertion- states she recovers her breath quickly upon rest    SURGICAL HISTORY: Past Surgical History:  Procedure Laterality Date  . ABDOMINAL HYSTERECTOMY N/A 05/16/2016   Procedure: HYSTERECTOMY TOTAL  ABDOMINAL WITH PELVIC MASS REMOVAL ,INFRACOLIC OMENTECTOMY;  Surgeon: Janie Morning, MD;  [facility];  Service: Gynecology;  Laterality: N/A;  . BILATERAL SALPINGOOPHORECTOMY Bilateral   . BREAST CYST EXCISION Right    age 89 - 39  . BREAST LUMPECTOMY Right    "took out golf-ball sized growth"  . CARDIAC CATHETERIZATION  2007  . CARPAL TUNNEL RELEASE Left   . CESAREAN SECTION  1976; 1977; 1980  . CHOLECYSTECTOMY  01/2006  . FRACTURE SURGERY    . LAPAROTOMY N/A 05/16/2016   Procedure: EXPLORATORY LAPAROTOMY;  Surgeon: Janie Morning, MD;  [facility];  Service: Gynecology;  Laterality: N/A;  . NECK EXPLORATION  01/31/2015  . PARATHYROIDECTOMY  01/31/2015  . PARATHYROIDECTOMY N/A 01/31/2015   Procedure: NECK EXPLORATION WITH PARATHYROIDECTOMY;  Surgeon: Armandina Gemma, MD;  Location: [place];  Service: General;  Laterality: N/A;  . TONSILLECTOMY    . TUBAL LIGATION  1980  . WRIST FRACTURE SURGERY Left 2014    I have reviewed the social history and family history with the patient and they are unchanged from previous note.  ALLERGIES:  is allergic to contrast media [iodinated diagnostic agents], iohexol, and bee venom.  MEDICATIONS:    Current Outpatient Medications  Medication Sig Dispense Refill  . apixaban (ELIQUIS) 5 MG TABS tablet Take 1 tablet (5 mg total) by mouth 2 (two) times daily. 60 tablet 5  . aspirin EC 81 MG tablet Take 81 mg by mouth daily.    Marland Kitchen atenolol (TENORMIN) 25 MG tablet 1 tablet daily and an extra 1/2 tablet as needed for palpitations (Patient taking differently: Take 25 mg by mouth daily. )    . Cholecalciferol (CVS VIT D 5000 HIGH-POTENCY) 5000 UNITS capsule Take 5,000 Units by mouth daily.    Marland Kitchen dexlansoprazole (DEXILANT) 60 MG capsule Take 60 mg by mouth daily.    . enalapril (VASOTEC) 5 MG tablet 1 tablet daily and an extra tablet if systolic BP is higher than 150 (Patient taking differently: Take 5 mg by mouth daily. 1 tablet daily and an extra tablet if systolic BP is higher than 150)    . EPINEPHrine (EPIPEN 2-PAK) 0.3 mg/0.3 mL SOAJ injection Inject 0.3 mg into the muscle daily as needed (allergic reaction).     Marland Kitchen escitalopram (LEXAPRO) 20 MG tablet Take 20 mg by mouth daily.     Marland Kitchen ibuprofen (ADVIL,MOTRIN) 200 MG tablet Take 400-800 mg by mouth every 8 (eight) hours as needed for mild pain or moderate pain.    Marland Kitchen ibuprofen (ADVIL,MOTRIN) 800 MG tablet     . nitroGLYCERIN (NITROSTAT) 0.4 MG SL tablet Place 0.4 mg under the tongue every 5 (five) minutes as needed for chest pain.     No current facility-administered medications for this visit.    PHYSICAL EXAMINATION: ECOG PERFORMANCE STATUS: 0 - Asymptomatic  Vitals:   07/21/20 1400  BP: 113/74  Pulse: (!) 55  Resp: 20  Temp: 97.8 F (36.6 C)  SpO2: 96%   Filed Weights   07/21/20 1400  Weight: 164 lb 4.8 oz (74.5 kg)    Due to COVID19 we will limit examination to appearance. Patient had no complaints.  GENERAL:alert, no distress and comfortable SKIN: skin color normal, no rashes or significant lesions EYES: normal, Conjunctiva are pink and non-injected, sclera clear  NEURO: alert & oriented x 3 with fluent speech   LABORATORY  DATA:  I have reviewed the data as listed CBC Latest Ref Rng & Units 07/22/2019 05/17/2016 05/14/2016  WBC 4.0 - 10.5 K/uL 4.9 11.1(H) 5.1  Hemoglobin 12.0 - 15.0 g/dL 14.7 13.5 14.5  Hematocrit 36 - 46 % 45.2 39.7 42.5  Platelets 150 - 400 K/uL 191 188 193     CMP Latest Ref Rng & Units 07/22/2019 07/02/2016 05/17/2016  Glucose 70 - 99 mg/dL 99 - 123(H)  BUN 6 - 20 mg/dL 14 - 11  Creatinine 0.44 - 1.00 mg/dL 0.99 - 1.03(H)  Sodium 135 - 145 mmol/L 140 - 137  Potassium 3.5 - 5.1 mmol/L 4.9 - 4.9  Chloride 98 - 111 mmol/L 109 - 106  CO2 22 - 32 mmol/L  25 - 27  Calcium 8.9 - 10.3 mg/dL 10.6(H) 10.2 10.5(H)  Total Protein 6.5 - 8.1 g/dL 6.6 - -  Total Bilirubin 0.3 - 1.2 mg/dL 0.3 - -  Alkaline Phos 38 - 126 U/L 135(H) - -  AST 15 - 41 U/L 15 - -  ALT 0 - 44 U/L 22 - -      RADIOGRAPHIC STUDIES: I have personally reviewed the radiological images as listed and agreed with the findings in the report. No results found.   ASSESSMENT & PLAN:  [name] is a 64 y.o. female with   1. Recurrent DVT, Factor 5 Leiden mutation (+) Herterozygote  -Her initial Left leg DVT occurred a few days after her Gallbladder surgery in 11/2005. She had recurrenceDVT in same legyears later, unprovoked.  -She notes around time of hysterectomy in 2017 she had right arm blood clots 2-3 days after surgery. -Her fourth and latest DVT in right calf on 04/04/19 was unprovoked. -Her prior genetic testing showed Factor 5 Leiden mutation positive (Herterozygote) which is high risk for blood clots. Her risk of thrombosis is 4-8 times higher than average population. She will be on lifelong anticoagulation to prevent future thrombosis.  -I encourage her daughter and son to be tested for factor V Leiden mutation, they have 50% chance to be positive. I also have reinforced the importance of preventive strategies.  -She is currently on Eliquis 5mg  BID. Tolerating well. Will continue indefinitely.  -She is clinically  doing well and stable. No recent major bleeding. Will continue management with her PCP. I recommend she monitor her blood counts and kidney function.  -Continue Eliquis 5mg  BID. F/u with me as needed in the future.   2. Bladder Cancer, Genetics, Cancer screenings -She had early stagebladder cancer diagnosed in 2018. She did have resection by endoscopy and ECG. She is currently on Surveillance with cystoscopy yearly.  -She is followed by Dr. Nevada Crane at [facility]  -She also had pre-cancerous mass on ovaries. She underwent BSO and later had hysterectomy due to benign mass.  -2 close relatives had kidney cancer.  3. Hypercalcemia, Tachycardia and HTN -She is s/p parathyroidectomy  -Continue medications. Will continue to f/u with PCP and Cardiologist    PLAN:  -Continue Eliquis 5mg  BID  -F/u as needed in the future.   No problem-specific Assessment & Plan notes found for this encounter.   No orders of the defined types were placed in this encounter.  All questions were answered. The patient knows to call the clinic with any problems, questions or concerns. No barriers to learning was detected. The total time spent in the appointment was 20 minutes.     Truitt Merle, MD 07/21/2020   I, Joslyn Devon, am acting as scribe for Truitt Merle, MD.   I have reviewed the above documentation for accuracy and completeness, and I agree with the above.

## 2020-07-20 ENCOUNTER — Other Ambulatory Visit: Payer: Self-pay | Admitting: Hematology

## 2020-07-20 DIAGNOSIS — I82409 Acute embolism and thrombosis of unspecified deep veins of unspecified lower extremity: Secondary | ICD-10-CM

## 2020-07-21 ENCOUNTER — Inpatient Hospital Stay (HOSPITAL_BASED_OUTPATIENT_CLINIC_OR_DEPARTMENT_OTHER): Payer: BC Managed Care – PPO | Admitting: Hematology

## 2020-07-21 ENCOUNTER — Other Ambulatory Visit: Payer: Self-pay

## 2020-07-21 ENCOUNTER — Inpatient Hospital Stay: Payer: BC Managed Care – PPO | Attending: Hematology

## 2020-07-21 VITALS — BP 113/74 | HR 55 | Temp 97.8°F | Resp 20 | Ht 62.0 in | Wt 164.3 lb

## 2020-07-21 DIAGNOSIS — D6851 Activated protein C resistance: Secondary | ICD-10-CM | POA: Insufficient documentation

## 2020-07-21 DIAGNOSIS — R000 Tachycardia, unspecified: Secondary | ICD-10-CM | POA: Insufficient documentation

## 2020-07-21 DIAGNOSIS — Z8041 Family history of malignant neoplasm of ovary: Secondary | ICD-10-CM | POA: Diagnosis not present

## 2020-07-21 DIAGNOSIS — I739 Peripheral vascular disease, unspecified: Secondary | ICD-10-CM | POA: Diagnosis not present

## 2020-07-21 DIAGNOSIS — F418 Other specified anxiety disorders: Secondary | ICD-10-CM | POA: Insufficient documentation

## 2020-07-21 DIAGNOSIS — Z7982 Long term (current) use of aspirin: Secondary | ICD-10-CM | POA: Insufficient documentation

## 2020-07-21 DIAGNOSIS — I10 Essential (primary) hypertension: Secondary | ICD-10-CM | POA: Diagnosis not present

## 2020-07-21 DIAGNOSIS — I82409 Acute embolism and thrombosis of unspecified deep veins of unspecified lower extremity: Secondary | ICD-10-CM

## 2020-07-21 DIAGNOSIS — M797 Fibromyalgia: Secondary | ICD-10-CM | POA: Insufficient documentation

## 2020-07-21 DIAGNOSIS — Z79899 Other long term (current) drug therapy: Secondary | ICD-10-CM | POA: Insufficient documentation

## 2020-07-21 DIAGNOSIS — Z7901 Long term (current) use of anticoagulants: Secondary | ICD-10-CM | POA: Diagnosis not present

## 2020-07-21 DIAGNOSIS — Z86718 Personal history of other venous thrombosis and embolism: Secondary | ICD-10-CM | POA: Insufficient documentation

## 2020-07-21 DIAGNOSIS — K219 Gastro-esophageal reflux disease without esophagitis: Secondary | ICD-10-CM | POA: Insufficient documentation

## 2020-07-21 DIAGNOSIS — E785 Hyperlipidemia, unspecified: Secondary | ICD-10-CM | POA: Insufficient documentation

## 2020-07-21 DIAGNOSIS — Z90722 Acquired absence of ovaries, bilateral: Secondary | ICD-10-CM | POA: Diagnosis not present

## 2020-07-21 DIAGNOSIS — Z8551 Personal history of malignant neoplasm of bladder: Secondary | ICD-10-CM | POA: Diagnosis not present

## 2020-07-21 DIAGNOSIS — M129 Arthropathy, unspecified: Secondary | ICD-10-CM | POA: Insufficient documentation

## 2020-07-21 DIAGNOSIS — Z90710 Acquired absence of both cervix and uterus: Secondary | ICD-10-CM | POA: Insufficient documentation

## 2020-07-21 MED ORDER — APIXABAN 5 MG PO TABS: 5.0000 mg | ORAL_TABLET | Freq: Two times a day (BID) | ORAL | 5 refills | Status: AC

## 2020-07-22 ENCOUNTER — Encounter: Payer: Self-pay | Admitting: Hematology

## 2020-08-30 ENCOUNTER — Other Ambulatory Visit: Payer: BC Managed Care – PPO

## 2021-01-30 ENCOUNTER — Other Ambulatory Visit: Payer: Self-pay

## 2021-01-30 ENCOUNTER — Ambulatory Visit
Admission: RE | Admit: 2021-01-30 | Discharge: 2021-01-30 | Disposition: A | Discharge status: Home / Self Care | Payer: BC Managed Care – PPO | Source: Ambulatory Visit | Attending: Family Medicine | Admitting: Family Medicine

## 2021-01-30 DIAGNOSIS — N6310 Unspecified lump in the right breast, unspecified quadrant: Secondary | ICD-10-CM

## 2021-10-09 ENCOUNTER — Encounter (HOSPITAL_COMMUNITY): Payer: Self-pay | Admitting: Emergency Medicine

## 2021-10-09 ENCOUNTER — Emergency Department (HOSPITAL_COMMUNITY)
Admission: EM | Admit: 2021-10-09 | Discharge: 2021-10-10 | Discharge status: Against Medical Advice | Payer: BC Managed Care – PPO | Attending: Emergency Medicine | Admitting: Emergency Medicine

## 2021-10-09 ENCOUNTER — Emergency Department (HOSPITAL_COMMUNITY): Payer: BC Managed Care – PPO

## 2021-10-09 ENCOUNTER — Other Ambulatory Visit: Payer: Self-pay

## 2021-10-09 DIAGNOSIS — Z5321 Procedure and treatment not carried out due to patient leaving prior to being seen by health care provider: Secondary | ICD-10-CM | POA: Diagnosis not present

## 2021-10-09 DIAGNOSIS — R61 Generalized hyperhidrosis: Secondary | ICD-10-CM | POA: Insufficient documentation

## 2021-10-09 DIAGNOSIS — R079 Chest pain, unspecified: Secondary | ICD-10-CM | POA: Insufficient documentation

## 2021-10-09 DIAGNOSIS — R110 Nausea: Secondary | ICD-10-CM | POA: Diagnosis not present

## 2021-10-09 LAB — CBC
HCT: 45 % (ref 36.0–46.0)
Hemoglobin: 14.9 g/dL (ref 12.0–15.0)
MCH: 31.6 pg (ref 26.0–34.0)
MCHC: 33.1 g/dL (ref 30.0–36.0)
MCV: 95.5 fL (ref 80.0–100.0)
Platelets: 198 10*3/uL (ref 150–400)
RBC: 4.71 MIL/uL (ref 3.87–5.11)
RDW: 11.9 % (ref 11.5–15.5)
WBC: 8.4 10*3/uL (ref 4.0–10.5)
nRBC: 0 % (ref 0.0–0.2)

## 2021-10-09 LAB — BASIC METABOLIC PANEL
Anion gap: 8 (ref 5–15)
BUN: 24 mg/dL — ABNORMAL HIGH (ref 8–23)
CO2: 24 mmol/L (ref 22–32)
Calcium: 11.2 mg/dL — ABNORMAL HIGH (ref 8.9–10.3)
Chloride: 108 mmol/L (ref 98–111)
Creatinine, Ser: 1.16 mg/dL — ABNORMAL HIGH (ref 0.44–1.00)
GFR, Estimated: 53 mL/min — ABNORMAL LOW (ref >60)
Glucose, Bld: 83 mg/dL (ref 70–99)
Potassium: 4.1 mmol/L (ref 3.5–5.1)
Sodium: 140 mmol/L (ref 135–145)

## 2021-10-09 LAB — TROPONIN I (HIGH SENSITIVITY)
Troponin I (High Sensitivity): <2 ng/L (ref <18)
Troponin I (High Sensitivity): 3 ng/L (ref <18)

## 2021-10-09 NOTE — ED Triage Notes (Signed)
Pt via EMS for eval of sudden onset chest pain while walking around [employer] with associated nausea and diaphoresis. Pt took 324 asa earlier today. No nitro given by EMS d/t bp of 110/80. 4 mg zofran given by EMS.

## 2021-10-09 NOTE — ED Notes (Signed)
Patient states she is leaving. 

## 2021-10-09 NOTE — ED Provider Triage Note (Signed)
Emergency Medicine Provider Triage Evaluation Note  [name] , a 66 y.o. female  was evaluated in triage.  Pt complains of chest pain starting today while patient was walking around [place]. Took 324 mg ASA prior to EMS arrival. EMS gave zofran.   Review of Systems  Positive: Chest pain, nausea, diaphoresis, lightheadedness Negative: Fever, chills, cough, leg pain or swelling  Physical Exam  BP 107/74 (BP Location: Right Arm)    Pulse 62    Temp 97.7 F (36.5 C) (Oral)    Resp 16    Ht 5\' 2"  (1.575 m)    Wt 74.4 kg    SpO2 99%    BMI 30.00 kg/m  Gen:   Awake, no distress   Resp:  Normal effort  MSK:   Moves extremities without difficulty  Other:    Medical Decision Making  Medically screening exam initiated at 5:13 PM.  Appropriate orders placed.  [name] was informed that the remainder of the evaluation will be completed by another provider, this initial triage assessment does not replace that evaluation, and the importance of remaining in the ED until their evaluation is complete.     Kateri Plummer, PA-C 10/09/21 1713

## 2021-11-21 ENCOUNTER — Encounter (HOSPITAL_BASED_OUTPATIENT_CLINIC_OR_DEPARTMENT_OTHER): Payer: Self-pay | Admitting: Urology

## 2021-11-21 ENCOUNTER — Other Ambulatory Visit: Payer: Self-pay

## 2021-11-21 ENCOUNTER — Emergency Department (HOSPITAL_BASED_OUTPATIENT_CLINIC_OR_DEPARTMENT_OTHER)
Admission: EM | Admit: 2021-11-21 | Discharge: 2021-11-21 | Disposition: A | Discharge status: Home / Self Care | Payer: BC Managed Care – PPO | Attending: Emergency Medicine | Admitting: Emergency Medicine

## 2021-11-21 ENCOUNTER — Emergency Department (HOSPITAL_BASED_OUTPATIENT_CLINIC_OR_DEPARTMENT_OTHER): Payer: BC Managed Care – PPO

## 2021-11-21 DIAGNOSIS — K5900 Constipation, unspecified: Secondary | ICD-10-CM | POA: Insufficient documentation

## 2021-11-21 DIAGNOSIS — Z7982 Long term (current) use of aspirin: Secondary | ICD-10-CM | POA: Insufficient documentation

## 2021-11-21 DIAGNOSIS — Z7901 Long term (current) use of anticoagulants: Secondary | ICD-10-CM | POA: Diagnosis not present

## 2021-11-21 DIAGNOSIS — R110 Nausea: Secondary | ICD-10-CM | POA: Diagnosis not present

## 2021-11-21 DIAGNOSIS — R1013 Epigastric pain: Secondary | ICD-10-CM | POA: Insufficient documentation

## 2021-11-21 DIAGNOSIS — R1010 Upper abdominal pain, unspecified: Secondary | ICD-10-CM | POA: Diagnosis not present

## 2021-11-21 DIAGNOSIS — R1310 Dysphagia, unspecified: Secondary | ICD-10-CM | POA: Insufficient documentation

## 2021-11-21 LAB — COMPREHENSIVE METABOLIC PANEL
ALT: 24 U/L (ref 0–44)
AST: 25 U/L (ref 15–41)
Albumin: 4.2 g/dL (ref 3.5–5.0)
Alkaline Phosphatase: 70 U/L (ref 38–126)
Anion gap: 8 (ref 5–15)
BUN: 19 mg/dL (ref 8–23)
CO2: 27 mmol/L (ref 22–32)
Calcium: 11.9 mg/dL — ABNORMAL HIGH (ref 8.9–10.3)
Chloride: 101 mmol/L (ref 98–111)
Creatinine, Ser: 1.07 mg/dL — ABNORMAL HIGH (ref 0.44–1.00)
GFR, Estimated: 58 mL/min — ABNORMAL LOW (ref >60)
Glucose, Bld: 95 mg/dL (ref 70–99)
Potassium: 3.8 mmol/L (ref 3.5–5.1)
Sodium: 136 mmol/L (ref 135–145)
Total Bilirubin: 0.6 mg/dL (ref 0.3–1.2)
Total Protein: 7 g/dL (ref 6.5–8.1)

## 2021-11-21 LAB — URINALYSIS, ROUTINE W REFLEX MICROSCOPIC
Bilirubin Urine: NEGATIVE
Glucose, UA: NEGATIVE mg/dL
Hgb urine dipstick: NEGATIVE
Ketones, ur: NEGATIVE mg/dL
Nitrite: NEGATIVE
Protein, ur: NEGATIVE mg/dL
Specific Gravity, Urine: 1.01 (ref 1.005–1.030)
pH: 6 (ref 5.0–8.0)

## 2021-11-21 LAB — URINALYSIS, MICROSCOPIC (REFLEX)

## 2021-11-21 LAB — CBC WITH DIFFERENTIAL/PLATELET
Abs Immature Granulocytes: 0.02 10*3/uL (ref 0.00–0.07)
Basophils Absolute: 0.1 10*3/uL (ref 0.0–0.1)
Basophils Relative: 1 %
Eosinophils Absolute: 0.2 10*3/uL (ref 0.0–0.5)
Eosinophils Relative: 2 %
HCT: 42.5 % (ref 36.0–46.0)
Hemoglobin: 14.1 g/dL (ref 12.0–15.0)
Immature Granulocytes: 0 %
Lymphocytes Relative: 39 %
Lymphs Abs: 2.9 10*3/uL (ref 0.7–4.0)
MCH: 30.3 pg (ref 26.0–34.0)
MCHC: 33.2 g/dL (ref 30.0–36.0)
MCV: 91.4 fL (ref 80.0–100.0)
Monocytes Absolute: 0.6 10*3/uL (ref 0.1–1.0)
Monocytes Relative: 8 %
Neutro Abs: 3.7 10*3/uL (ref 1.7–7.7)
Neutrophils Relative %: 50 %
Platelets: 226 10*3/uL (ref 150–400)
RBC: 4.65 MIL/uL (ref 3.87–5.11)
RDW: 12 % (ref 11.5–15.5)
WBC: 7.5 10*3/uL (ref 4.0–10.5)
nRBC: 0 % (ref 0.0–0.2)

## 2021-11-21 LAB — LIPASE, BLOOD: Lipase: 38 U/L (ref 11–51)

## 2021-11-21 MED ORDER — ALUM & MAG HYDROXIDE-SIMETH 200-200-20 MG/5ML PO SUSP
30.0000 mL | Freq: Once | ORAL | Status: AC
Start: 2021-11-21 — End: 2021-11-21
  Administered 2021-11-21: 30 mL via ORAL
  Filled 2021-11-21: qty 30

## 2021-11-21 MED ORDER — FAMOTIDINE 20 MG PO TABS: 20.0000 mg | ORAL_TABLET | Freq: Two times a day (BID) | ORAL | 0 refills | Status: AC

## 2021-11-21 MED ORDER — LACTATED RINGERS IV BOLUS
1000.0000 mL | Freq: Once | INTRAVENOUS | Status: AC
Start: 2021-11-21 — End: 2021-11-21
  Administered 2021-11-21: 1000 mL via INTRAVENOUS

## 2021-11-21 MED ORDER — PANTOPRAZOLE SODIUM 40 MG PO TBEC: 40.0000 mg | DELAYED_RELEASE_TABLET | Freq: Every day | ORAL | 0 refills | Status: AC

## 2021-11-21 MED ORDER — LIDOCAINE VISCOUS HCL 2 % MT SOLN
15.0000 mL | Freq: Once | OROMUCOSAL | Status: AC
Start: 2021-11-21 — End: 2021-11-21
  Administered 2021-11-21: 15 mL via ORAL
  Filled 2021-11-21: qty 15

## 2021-11-21 MED ORDER — SUCRALFATE 1 G PO TABS: 1.0000 g | ORAL_TABLET | Freq: Three times a day (TID) | ORAL | 0 refills | Status: AC

## 2021-11-21 NOTE — ED Triage Notes (Signed)
Epigastric pain after eating x 3 months ?States feels like food gets stuck and can feel it moving down  ?States pain worse with eating and drinking ?States feel bloated  ? ?H/o hiatal hernia ?

## 2021-11-21 NOTE — ED Provider Notes (Signed)
?[facility] ?Provider Note ? ? ?CSN: [id number] ?Arrival date & time: 11/21/21  2010 ? ?  ? ?History ? ?Chief Complaint  ?Patient presents with  ? Dysphagia  ? ? ?[name] is a 66 y.o. female. ? ?HPI ?66 year old female presents with abdominal pain.  Is been ongoing for months.  Every time she eats, while she is eating it feels like she is getting more more bloated and having a lot of epigastric pain.  At first it was only while she is eating but over the last 5 days its been more constant and worse with eating.  Everything feels like it is getting stuck in her stomach and then eventually will go down.  No vomiting but occasionally she is got nauseated.  Right now the pain is about a 4 out of 10.  No fevers, diarrhea, or chest pain.  She has a little bit of constipation but she states that is because she is avoiding eating due to the discomfort. ? ?Home Medications ?Prior to Admission medications   ?Medication Sig Start Date End Date Taking? Authorizing Provider  ?famotidine (PEPCID) 20 MG tablet Take 1 tablet (20 mg total) by mouth 2 (two) times daily. 11/21/21  Yes Sherwood Gambler, MD  ?pantoprazole (PROTONIX) 40 MG tablet Take 1 tablet (40 mg total) by mouth daily. 11/21/21  Yes Sherwood Gambler, MD  ?sucralfate (CARAFATE) 1 g tablet Take 1 tablet (1 g total) by mouth 4 (four) times daily -  with meals and at bedtime. 11/21/21 12/21/21 Yes Sherwood Gambler, MD  ?apixaban (ELIQUIS) 5 MG TABS tablet Take 1 tablet (5 mg total) by mouth 2 (two) times daily. 07/21/20   Truitt Merle, MD  ?aspirin EC 81 MG tablet Take 81 mg by mouth daily.    [provider]  ?atenolol (TENORMIN) 25 MG tablet 1 tablet daily and an extra 1/2 tablet as needed for palpitations ?Patient taking differently: Take 25 mg by mouth daily.  11/30/13   Jettie Booze, MD  ?Cholecalciferol ([employer] VIT D 5000 HIGH-POTENCY) 5000 UNITS capsule Take 5,000 Units by mouth daily.    [provider]   ?dexlansoprazole (DEXILANT) 60 MG capsule Take 60 mg by mouth daily.    [provider]  ?enalapril (VASOTEC) 5 MG tablet 1 tablet daily and an extra tablet if systolic BP is higher than 150 ?Patient taking differently: Take 5 mg by mouth daily. 1 tablet daily and an extra tablet if systolic BP is higher than 150 11/30/13   Jettie Booze, MD  ?EPINEPHrine (EPIPEN 2-PAK) 0.3 mg/0.3 mL SOAJ injection Inject 0.3 mg into the muscle daily as needed (allergic reaction).     [provider]  ?escitalopram (LEXAPRO) 20 MG tablet Take 20 mg by mouth daily.  11/12/14   [provider]  ?ibuprofen (ADVIL,MOTRIN) 200 MG tablet Take 400-800 mg by mouth every 8 (eight) hours as needed for mild pain or moderate pain.    [provider]  ?ibuprofen (ADVIL,MOTRIN) 800 MG tablet  06/25/16   [provider]  ?nitroGLYCERIN (NITROSTAT) 0.4 MG SL tablet Place 0.4 mg under the tongue every 5 (five) minutes as needed for chest pain.    [provider]  ?   ? ?Allergies    ?Contrast media [iodinated contrast media], Iohexol, and Bee venom   ? ?Review of Systems   ?Review of Systems  ?Constitutional:  Negative for fever.  ?Cardiovascular:  Negative for chest pain.  ?Gastrointestinal:  Positive for  abdominal pain and nausea. Negative for diarrhea and vomiting.  ? ?Physical Exam ?Updated Vital Signs ?BP (!) 168/77   Pulse 64   Temp 98.1 ?F (36.7 ?C) (Oral)   Resp 18   Ht '5\' 2"'$  (1.575 m)   Wt 74.4 kg   SpO2 97%   BMI 30.00 kg/m?  ?Physical Exam ?Vitals and nursing note reviewed.  ?Constitutional:   ?   Appearance: She is well-developed.  ?HENT:  ?   Head: Normocephalic and atraumatic.  ?Cardiovascular:  ?   Rate and Rhythm: Normal rate and regular rhythm.  ?   Heart sounds: Normal heart sounds.  ?Pulmonary:  ?   Effort: Pulmonary effort is normal.  ?   Breath sounds: Normal breath sounds.  ?Abdominal:  ?   Palpations: Abdomen is soft.  ?   Tenderness: There is abdominal  tenderness in the epigastric area.  ?Skin: ?   General: Skin is warm and dry.  ?Neurological:  ?   Mental Status: She is alert.  ? ? ?ED Results / Procedures / Treatments   ?Labs ?(all labs ordered are listed, but only abnormal results are displayed) ?Labs Reviewed  ?URINALYSIS, ROUTINE W REFLEX MICROSCOPIC - Abnormal; Notable for the following components:  ?    Result Value  ? Leukocytes,Ua SMALL (*)   ? All other components within normal limits  ?COMPREHENSIVE METABOLIC PANEL - Abnormal; Notable for the following components:  ? Creatinine, Ser 1.07 (*)   ? Calcium 11.9 (*)   ? GFR, Estimated 58 (*)   ? All other components within normal limits  ?URINALYSIS, MICROSCOPIC (REFLEX) - Abnormal; Notable for the following components:  ? Bacteria, UA FEW (*)   ? All other components within normal limits  ?LIPASE, BLOOD  ?CBC WITH DIFFERENTIAL/PLATELET  ? ? ?EKG ?EKG Interpretation ? ?Date/Time:  Wednesday November 21 2021 21:28:45 EDT ?Ventricular Rate:  66 ?PR Interval:  199 ?QRS Duration: 114 ?QT Interval:  404 ?QTC Calculation: 424 ?R Axis:   24 ?Text Interpretation: Sinus rhythm Consider left atrial enlargement Borderline intraventricular conduction delay Low voltage, precordial leads RSR' in V1 or V2, right VCD or RVH Confirmed by Sherwood Gambler [phone]) on 11/21/2021 9:31:29 PM ? ?Radiology ?CT ABDOMEN PELVIS WO CONTRAST ? ?Result Date: 11/21/2021 ?CLINICAL DATA:  Acute abdominal pain. EXAM: CT ABDOMEN AND PELVIS WITHOUT CONTRAST TECHNIQUE: Multidetector CT imaging of the abdomen and pelvis was performed following the standard protocol without IV contrast. RADIATION DOSE REDUCTION: This exam was performed according to the departmental dose-optimization program which includes automated exposure control, adjustment of the mA and/or kV according to patient size and/or use of iterative reconstruction technique. COMPARISON:  CT abdomen and pelvis 04/22/2018. FINDINGS: Lower chest: No acute abnormality. Hepatobiliary: No focal  liver abnormality is seen. Status post cholecystectomy. No biliary dilatation. Pancreas: Unremarkable. No pancreatic ductal dilatation or surrounding inflammatory changes. Spleen: Normal in size without focal abnormality. Adrenals/Urinary Tract: Again seen is scarring in the superior pole the left kidney. There is a 4 mm calculus in the superior pole the left kidney which has slightly increased in size. Right kidney is within normal limits. There is no hydronephrosis or perinephric fluid. The adrenal glands and bladder are within normal limits. Stomach/Bowel: Multiple small bowel loops approximate the anterior abdominal wall, unchanged. No evidence of bowel wall thickening, distention, or inflammatory changes. There is a small hiatal hernia. The stomach is otherwise within normal limits the appendix is not visualized. Vascular/Lymphatic: Aortic atherosclerosis. No enlarged abdominal or pelvic lymph nodes.  Reproductive: Status post hysterectomy. No adnexal masses. Other: No abdominal wall hernia or abnormality. No abdominopelvic ascites. Musculoskeletal: Degenerative changes affect the spine. IMPRESSION: 1. No acute localizing process in the abdomen or pelvis. 2. Nonobstructing single left renal calculus has mildly increased in size. 3.  Aortic Atherosclerosis (ICD10-I70.0). Electronically Signed   By: Ronney Asters M.D.   On: 11/21/2021 21:48   ? ?Procedures ?Procedures  ? ? ?Medications Ordered in ED ?Medications  ?lactated ringers bolus 1,000 mL ( Intravenous Stopped 11/21/21 2238)  ?alum & mag hydroxide-simeth (MAALOX/MYLANTA) 200-200-20 MG/5ML suspension 30 mL (30 mLs Oral Given 11/21/21 2140)  ?  And  ?lidocaine (XYLOCAINE) 2 % viscous mouth solution 15 mL (15 mLs Oral Given 11/21/21 2141)  ? ? ?ED Course/ Medical Decision Making/ A&P ?  ?                        ?Medical Decision Making ?Amount and/or Complexity of Data Reviewed ?Labs: ordered. ?Radiology: ordered. ? ?Risk ?OTC drugs. ?Prescription drug  management. ? ? ?Patient's labs were reviewed/interpreted and there is no LFT dysfunction, normal lipase, normal WBC and hemoglobin.  Urinalysis is pretty unremarkable as well.  CT scan was obtained.  While ideally it would have been p

## 2021-11-21 NOTE — Discharge Instructions (Addendum)
If you develop worsening, continued, or recurrent abdominal pain, uncontrolled vomiting, fever, chest or back pain, or any other new/concerning symptoms then return to the ER for evaluation.  

## 2021-11-21 NOTE — ED Notes (Signed)
ED Provider at bedside. 

## 2022-01-21 ENCOUNTER — Other Ambulatory Visit: Payer: Self-pay | Admitting: Family Medicine

## 2022-01-21 DIAGNOSIS — N6310 Unspecified lump in the right breast, unspecified quadrant: Secondary | ICD-10-CM

## 2022-02-14 ENCOUNTER — Ambulatory Visit
Admission: RE | Admit: 2022-02-14 | Discharge: 2022-02-14 | Disposition: A | Discharge status: Home / Self Care | Payer: BC Managed Care – PPO | Source: Ambulatory Visit | Attending: Family Medicine | Admitting: Family Medicine

## 2022-02-14 DIAGNOSIS — N6310 Unspecified lump in the right breast, unspecified quadrant: Secondary | ICD-10-CM

## 2022-03-14 ENCOUNTER — Other Ambulatory Visit: Payer: Self-pay | Admitting: Family Medicine

## 2022-03-14 DIAGNOSIS — M810 Age-related osteoporosis without current pathological fracture: Secondary | ICD-10-CM

## 2022-05-30 ENCOUNTER — Other Ambulatory Visit: Payer: BC Managed Care – PPO

## 2022-10-31 ENCOUNTER — Ambulatory Visit
Admission: RE | Admit: 2022-10-31 | Discharge: 2022-10-31 | Disposition: A | Discharge status: Home / Self Care | Payer: BC Managed Care – PPO | Source: Ambulatory Visit | Attending: Family Medicine | Admitting: Family Medicine

## 2022-10-31 DIAGNOSIS — M810 Age-related osteoporosis without current pathological fracture: Secondary | ICD-10-CM
